# Patient Record
Sex: Female | Born: 1935 | Race: White | Hispanic: No | Smoking: Never smoker
Health system: Southern US, Community
[De-identification: ages and names within clinical notes are randomized; demographics above are authoritative.]

## PROBLEM LIST (undated history)

## (undated) DIAGNOSIS — I739 Peripheral vascular disease, unspecified: Secondary | ICD-10-CM

## (undated) DIAGNOSIS — E785 Hyperlipidemia, unspecified: Secondary | ICD-10-CM

## (undated) DIAGNOSIS — K219 Gastro-esophageal reflux disease without esophagitis: Secondary | ICD-10-CM

## (undated) DIAGNOSIS — I519 Heart disease, unspecified: Secondary | ICD-10-CM

## (undated) DIAGNOSIS — I10 Essential (primary) hypertension: Secondary | ICD-10-CM

## (undated) DIAGNOSIS — I4891 Unspecified atrial fibrillation: Secondary | ICD-10-CM

## (undated) DIAGNOSIS — H409 Unspecified glaucoma: Secondary | ICD-10-CM

## (undated) DIAGNOSIS — M1990 Unspecified osteoarthritis, unspecified site: Secondary | ICD-10-CM

## (undated) HISTORY — DX: Heart disease, unspecified: I51.9

## (undated) HISTORY — PX: TUBAL LIGATION: SHX77

## (undated) HISTORY — PX: CEREBRAL ANEURYSM REPAIR: SHX164

## (undated) HISTORY — PX: ABDOMINAL HYSTERECTOMY: SHX81

## (undated) HISTORY — PX: APPENDECTOMY: SHX54

## (undated) HISTORY — DX: Essential (primary) hypertension: I10

## (undated) HISTORY — DX: Unspecified osteoarthritis, unspecified site: M19.90

## (undated) HISTORY — PX: OTHER SURGICAL HISTORY: SHX169

## (undated) HISTORY — PX: DILATION AND CURETTAGE OF UTERUS: SHX78

---

## 1997-08-28 ENCOUNTER — Inpatient Hospital Stay (HOSPITAL_COMMUNITY): Admission: RE | Admit: 1997-08-28 | Discharge: 1997-08-29 | Payer: Self-pay | Admitting: *Deleted

## 2004-09-03 ENCOUNTER — Other Ambulatory Visit: Payer: Self-pay

## 2004-09-03 ENCOUNTER — Inpatient Hospital Stay: Payer: Self-pay | Admitting: Internal Medicine

## 2005-10-31 ENCOUNTER — Ambulatory Visit: Payer: Self-pay | Admitting: Family

## 2005-12-02 ENCOUNTER — Ambulatory Visit: Payer: Self-pay | Admitting: Psychiatry

## 2005-12-09 ENCOUNTER — Ambulatory Visit (HOSPITAL_COMMUNITY): Admission: RE | Admit: 2005-12-09 | Discharge: 2005-12-09 | Payer: Self-pay | Admitting: *Deleted

## 2006-03-06 ENCOUNTER — Inpatient Hospital Stay (HOSPITAL_COMMUNITY): Admission: RE | Admit: 2006-03-06 | Discharge: 2006-03-07 | Payer: Self-pay | Admitting: *Deleted

## 2007-09-09 ENCOUNTER — Other Ambulatory Visit: Payer: Self-pay

## 2007-09-09 ENCOUNTER — Observation Stay: Payer: Self-pay | Admitting: Internal Medicine

## 2007-10-25 ENCOUNTER — Ambulatory Visit: Payer: Self-pay | Admitting: Internal Medicine

## 2008-03-26 ENCOUNTER — Inpatient Hospital Stay: Payer: Self-pay | Admitting: Internal Medicine

## 2008-06-02 ENCOUNTER — Ambulatory Visit: Payer: Self-pay | Admitting: Internal Medicine

## 2008-06-29 ENCOUNTER — Emergency Department: Payer: Self-pay | Admitting: Emergency Medicine

## 2008-07-28 ENCOUNTER — Ambulatory Visit: Payer: Self-pay | Admitting: Gastroenterology

## 2009-07-20 ENCOUNTER — Inpatient Hospital Stay: Payer: Self-pay | Admitting: Internal Medicine

## 2009-08-02 ENCOUNTER — Inpatient Hospital Stay: Payer: Self-pay | Admitting: Internal Medicine

## 2010-08-19 ENCOUNTER — Ambulatory Visit: Payer: Self-pay | Admitting: Ophthalmology

## 2010-08-31 ENCOUNTER — Ambulatory Visit: Payer: Self-pay | Admitting: Ophthalmology

## 2010-10-05 ENCOUNTER — Observation Stay: Payer: Self-pay | Admitting: Internal Medicine

## 2011-01-11 ENCOUNTER — Ambulatory Visit: Payer: Self-pay | Admitting: Ophthalmology

## 2013-01-23 ENCOUNTER — Encounter: Payer: Self-pay | Admitting: Podiatry

## 2013-01-23 ENCOUNTER — Ambulatory Visit (INDEPENDENT_AMBULATORY_CARE_PROVIDER_SITE_OTHER): Payer: Medicare HMO | Admitting: Podiatry

## 2013-01-23 VITALS — BP 114/43 | HR 61 | Resp 16 | Ht 65.0 in | Wt 158.0 lb

## 2013-01-23 DIAGNOSIS — M79609 Pain in unspecified limb: Secondary | ICD-10-CM

## 2013-01-23 DIAGNOSIS — B351 Tinea unguium: Secondary | ICD-10-CM

## 2013-01-23 NOTE — Progress Notes (Signed)
Lori Roberts presents today a chief complaint of painful toenails one through 5 bilateral.  Objective: Vital signs are stable she is alert and oriented x3 pulses remain palpable bilateral. Nails are thick yellow dystrophic onychomycotic. Hammertoe deformities noted bilateral.  Assessment: Pain in limb secondary to onychomycosis 1 through 5 bilateral.  Plan: Debridement of nails in thickness and length as a covered service followup with her in 3 months.

## 2013-04-24 ENCOUNTER — Encounter: Payer: Self-pay | Admitting: Podiatry

## 2013-04-24 ENCOUNTER — Ambulatory Visit (INDEPENDENT_AMBULATORY_CARE_PROVIDER_SITE_OTHER): Payer: Medicare HMO | Admitting: Podiatry

## 2013-04-24 VITALS — BP 122/63 | HR 74 | Resp 16 | Ht 65.0 in | Wt 158.0 lb

## 2013-04-24 DIAGNOSIS — B351 Tinea unguium: Secondary | ICD-10-CM

## 2013-04-24 DIAGNOSIS — M79609 Pain in unspecified limb: Secondary | ICD-10-CM

## 2013-04-24 NOTE — Progress Notes (Signed)
She presents today with a chief complaint of painful toenails one through 5 bilateral.  Objective: Vital signs are stable she is alert and oriented x3. Pulses are palpable bilateral. Nails are thick yellow dystrophic clinically mycotic 1 through 5 bilateral foot.  Assessment: Pain in limb secondary to onychomycosis 1 through 5 bilateral.  Plan: Discussed the etiology pathology conservative versus surgical therapies debridement nails 1 through 5 bilateral is cover service secondary to pain.

## 2013-07-11 DIAGNOSIS — N189 Chronic kidney disease, unspecified: Secondary | ICD-10-CM | POA: Insufficient documentation

## 2013-07-11 DIAGNOSIS — R0602 Shortness of breath: Secondary | ICD-10-CM | POA: Insufficient documentation

## 2013-07-11 DIAGNOSIS — R011 Cardiac murmur, unspecified: Secondary | ICD-10-CM | POA: Insufficient documentation

## 2013-07-24 ENCOUNTER — Ambulatory Visit (INDEPENDENT_AMBULATORY_CARE_PROVIDER_SITE_OTHER): Payer: Medicare HMO | Admitting: Podiatry

## 2013-07-24 VITALS — BP 123/67 | HR 81 | Resp 16

## 2013-07-24 DIAGNOSIS — B351 Tinea unguium: Secondary | ICD-10-CM

## 2013-07-24 DIAGNOSIS — M79609 Pain in unspecified limb: Secondary | ICD-10-CM

## 2013-07-24 NOTE — Progress Notes (Signed)
She presents today with a chief complaint of painful elongated toenails one through 5 bilateral.  Objective: Ulcers are palpable bilateral. Nails are thick yellow dystrophic with mycotic and painful palpation.  Assessment: Pain in limb secondary to onychomycosis 1 through 5 bilateral.  Plan: Debridement of nails 1 through 5 bilateral covered service secondary to pain.

## 2013-10-24 ENCOUNTER — Encounter: Payer: Self-pay | Admitting: Podiatry

## 2013-10-24 ENCOUNTER — Ambulatory Visit (INDEPENDENT_AMBULATORY_CARE_PROVIDER_SITE_OTHER): Payer: Medicare HMO | Admitting: Podiatry

## 2013-10-24 DIAGNOSIS — M79676 Pain in unspecified toe(s): Secondary | ICD-10-CM

## 2013-10-24 DIAGNOSIS — M79609 Pain in unspecified limb: Secondary | ICD-10-CM

## 2013-10-24 DIAGNOSIS — B351 Tinea unguium: Secondary | ICD-10-CM

## 2013-10-24 NOTE — Progress Notes (Signed)
She presents today chief complaint of painful elongated toenails.  Objective: Thick yellow dystrophic with mycotic and painful palpation.  Assessment: Pain in limb secondary to onychomycosis 1 through 5 bilateral.  Plan: Debridement of nails 1 through 5 bilateral covered service secondary to pain.

## 2013-12-30 ENCOUNTER — Observation Stay: Payer: Self-pay | Admitting: Internal Medicine

## 2013-12-30 LAB — URINALYSIS, COMPLETE
Bilirubin,UR: NEGATIVE
Blood: NEGATIVE
Glucose,UR: NEGATIVE mg/dL (ref 0–75)
Ketone: NEGATIVE
Leukocyte Esterase: NEGATIVE
Nitrite: NEGATIVE
Ph: 6 (ref 4.5–8.0)
Protein: NEGATIVE
RBC,UR: NONE SEEN /HPF (ref 0–5)
Specific Gravity: 1.008 (ref 1.003–1.030)
Squamous Epithelial: NONE SEEN
WBC UR: NONE SEEN /HPF (ref 0–5)

## 2013-12-30 LAB — PROTIME-INR
INR: 1.9
Prothrombin Time: 21.4 secs — ABNORMAL HIGH (ref 11.5–14.7)

## 2013-12-30 LAB — CBC
HCT: 39.4 % (ref 35.0–47.0)
HGB: 12.5 g/dL (ref 12.0–16.0)
MCH: 28.6 pg (ref 26.0–34.0)
MCHC: 31.7 g/dL — AB (ref 32.0–36.0)
MCV: 90 fL (ref 80–100)
Platelet: 149 10*3/uL — ABNORMAL LOW (ref 150–440)
RBC: 4.35 10*6/uL (ref 3.80–5.20)
RDW: 15.8 % — ABNORMAL HIGH (ref 11.5–14.5)
WBC: 7.1 10*3/uL (ref 3.6–11.0)

## 2013-12-30 LAB — TROPONIN I
Troponin-I: 0.29 ng/mL — ABNORMAL HIGH
Troponin-I: 0.29 ng/mL — ABNORMAL HIGH

## 2013-12-30 LAB — BASIC METABOLIC PANEL
Anion Gap: 8 (ref 7–16)
BUN: 37 mg/dL — ABNORMAL HIGH (ref 7–18)
Calcium, Total: 9.2 mg/dL (ref 8.5–10.1)
Chloride: 107 mmol/L (ref 98–107)
Co2: 28 mmol/L (ref 21–32)
Creatinine: 1.63 mg/dL — ABNORMAL HIGH (ref 0.60–1.30)
EGFR (African American): 38 — ABNORMAL LOW
EGFR (Non-African Amer.): 31 — ABNORMAL LOW
Glucose: 94 mg/dL (ref 65–99)
Osmolality: 293 (ref 275–301)
Potassium: 3.6 mmol/L (ref 3.5–5.1)
Sodium: 143 mmol/L (ref 136–145)

## 2013-12-30 LAB — PRO B NATRIURETIC PEPTIDE: B-Type Natriuretic Peptide: 5077 pg/mL — ABNORMAL HIGH (ref 0–450)

## 2013-12-30 LAB — TSH: Thyroid Stimulating Horm: 2.89 u[IU]/mL

## 2013-12-30 LAB — CK TOTAL AND CKMB (NOT AT ARMC)
CK, Total: 87 U/L
CK-MB: 3.5 ng/mL (ref 0.5–3.6)

## 2013-12-31 LAB — BASIC METABOLIC PANEL
Anion Gap: 7 (ref 7–16)
BUN: 33 mg/dL — ABNORMAL HIGH (ref 7–18)
CO2: 27 mmol/L (ref 21–32)
CREATININE: 1.42 mg/dL — AB (ref 0.60–1.30)
Calcium, Total: 8.5 mg/dL (ref 8.5–10.1)
Chloride: 111 mmol/L — ABNORMAL HIGH (ref 98–107)
GFR CALC AF AMER: 45 — AB
GFR CALC NON AF AMER: 37 — AB
Glucose: 97 mg/dL (ref 65–99)
Osmolality: 296 (ref 275–301)
POTASSIUM: 3.4 mmol/L — AB (ref 3.5–5.1)
Sodium: 145 mmol/L (ref 136–145)

## 2013-12-31 LAB — TROPONIN I: TROPONIN-I: 0.26 ng/mL — AB

## 2014-01-10 ENCOUNTER — Emergency Department: Payer: Self-pay | Admitting: Emergency Medicine

## 2014-01-10 LAB — URINALYSIS, COMPLETE
BILIRUBIN, UR: NEGATIVE
Bacteria: NONE SEEN
Blood: NEGATIVE
GLUCOSE, UR: NEGATIVE mg/dL (ref 0–75)
Hyaline Cast: 6
KETONE: NEGATIVE
LEUKOCYTE ESTERASE: NEGATIVE
NITRITE: NEGATIVE
Ph: 5 (ref 4.5–8.0)
Protein: NEGATIVE
Specific Gravity: 1.016 (ref 1.003–1.030)
Squamous Epithelial: 1

## 2014-01-10 LAB — COMPREHENSIVE METABOLIC PANEL
AST: 20 U/L (ref 15–37)
Albumin: 3.6 g/dL (ref 3.4–5.0)
Alkaline Phosphatase: 81 U/L
Anion Gap: 5 — ABNORMAL LOW (ref 7–16)
BUN: 37 mg/dL — ABNORMAL HIGH (ref 7–18)
Bilirubin,Total: 0.4 mg/dL (ref 0.2–1.0)
CREATININE: 1.87 mg/dL — AB (ref 0.60–1.30)
Calcium, Total: 9 mg/dL (ref 8.5–10.1)
Chloride: 106 mmol/L (ref 98–107)
Co2: 31 mmol/L (ref 21–32)
EGFR (African American): 33 — ABNORMAL LOW
GFR CALC NON AF AMER: 27 — AB
Glucose: 144 mg/dL — ABNORMAL HIGH (ref 65–99)
OSMOLALITY: 285 (ref 275–301)
Potassium: 3.8 mmol/L (ref 3.5–5.1)
SGPT (ALT): 28 U/L
Sodium: 142 mmol/L (ref 136–145)
TOTAL PROTEIN: 6.6 g/dL (ref 6.4–8.2)

## 2014-01-10 LAB — CBC
HCT: 39.5 % (ref 35.0–47.0)
HGB: 13 g/dL (ref 12.0–16.0)
MCH: 29.7 pg (ref 26.0–34.0)
MCHC: 33 g/dL (ref 32.0–36.0)
MCV: 90 fL (ref 80–100)
Platelet: 147 10*3/uL — ABNORMAL LOW (ref 150–440)
RBC: 4.38 10*6/uL (ref 3.80–5.20)
RDW: 15.5 % — ABNORMAL HIGH (ref 11.5–14.5)
WBC: 7 10*3/uL (ref 3.6–11.0)

## 2014-01-10 LAB — CK TOTAL AND CKMB (NOT AT ARMC)
CK, Total: 65 U/L
CK-MB: 3.3 ng/mL (ref 0.5–3.6)

## 2014-01-10 LAB — TROPONIN I: Troponin-I: 0.24 ng/mL — ABNORMAL HIGH

## 2014-01-29 ENCOUNTER — Ambulatory Visit: Payer: Medicare HMO | Admitting: Podiatry

## 2014-02-17 ENCOUNTER — Ambulatory Visit (INDEPENDENT_AMBULATORY_CARE_PROVIDER_SITE_OTHER): Payer: Medicare HMO | Admitting: Podiatry

## 2014-02-17 ENCOUNTER — Ambulatory Visit: Payer: Medicare HMO | Admitting: Podiatry

## 2014-02-17 DIAGNOSIS — B351 Tinea unguium: Secondary | ICD-10-CM

## 2014-02-17 DIAGNOSIS — M79676 Pain in unspecified toe(s): Secondary | ICD-10-CM

## 2014-02-17 NOTE — Progress Notes (Signed)
She presents today chief complaint of painful elongated toenails.  Objective: Thick yellow dystrophic with mycotic and painful palpation.  Assessment: Pain in limb secondary to onychomycosis 1 through 5 bilateral.  Plan: Debridement of nails 1 through 5 bilateral covered service secondary to pain.

## 2014-05-19 ENCOUNTER — Ambulatory Visit: Payer: Medicare HMO

## 2014-05-28 ENCOUNTER — Ambulatory Visit (INDEPENDENT_AMBULATORY_CARE_PROVIDER_SITE_OTHER): Payer: Commercial Managed Care - HMO | Admitting: Podiatry

## 2014-05-28 DIAGNOSIS — M79676 Pain in unspecified toe(s): Secondary | ICD-10-CM

## 2014-05-28 DIAGNOSIS — B351 Tinea unguium: Secondary | ICD-10-CM

## 2014-05-28 NOTE — Progress Notes (Signed)
Presents today chief complaint of painful elongated toenails.  Objective: Pulses are palpable bilateral nails are thick, yellow dystrophic onychomycosis and painful palpation.   Assessment: Onychomycosis with pain in limb.  Plan: Treatment of nails in thickness and length as covered service secondary to pain.  

## 2014-07-12 NOTE — Consult Note (Signed)
PATIENT NAME:  Lori Roberts, PRAZAK MR#:  [id number] DATE OF BIRTH:  03/17/1922  DATE OF CONSULTATION:  12/31/2013  REFERRING PHYSICIAN:  Emergency Room  CONSULTING PHYSICIAN:  Fabienne Nolasco D. Clayborn Bigness, MD  CARDIOLOGY CONSULTATION    PRIMARY CARE PHYSICIAN:  Dr. Lavera Guise.  INDICATIONS: The patient has palpitations.   HISTORY OF PRESENT ILLNESS: The patient is a 81 year old female with a history ofparoxysmal AFib, SVT, coronary artery disease, hypertension, peripheral vascular disease, and hypertension, who presented with worsening palpitations and tachycardia while at rest. This was going on for a few minutes and did not get better, so she finally came to the Emergency Room. In the ER she was found to be in rapid atrial fibrillation with elevated blood pressure. She was given Lopressor. She was found to have an elevated troponin, and she is being admitted for further evaluation. She denied any discrete chest pain. She had minimal shortness of breath, mild vertigo, no syncope. She has not changed her medications, and has been compliant with the doses. She saw her primary care physician about a week ago.  The patient, by the time I saw her, felt better. Her palpitations had improved. She felt back to normal.   PAST MEDICAL HISTORY: Atrial fibrillation, hypertension, hyperlipidemia, renal artery stenosis, GERD, coronary artery disease, chronic renal insufficiency, history of SVT.   PAST SURGICAL HISTORY: Hysterectomy, brain aneurysm, right renal artery stent placement, PCI and stents to coronary arteries.   ALLERGIES: IODINE, AZOR, CLINDAMYCIN, SULFA, PENICILLIN, PHENOBARBITAL, CLONIDINE.  SOCIAL HISTORY: Lives alone. Uses a cane. Uses a bicycle 3 times a week. Does not smoke or drink.    FAMILY HISTORY: Brother died of cancer. Father died of myocardial infarction at age 81.  Marland Kitchen No nausea or vomiting. Denies fevers or chills. No sweats. No weight loss, No weight gain. Denies hemoptysis or hematemesis. No bright  red blood per rectum. No vision change or hearing change. Denies sputum production. Denies cough. She had significant palpitations, tachycardia, mild weakness, mild fatigue, no chest pain, no syncope.  MEDICATIONS: Aspirin 81 mg a day, Benicar 20 a day, Bumex 0.5 a day, Cetirizine  10 mg as needed, Coumadin 5 mg a day, diazepam 5 mg 4 times a day, Imdur 30 mg a day, Lipitor 20 a day, Plavix 75 a day, sotalol 120 mg twice a day, Travatan eye drops daily as needed, VESIcare 5 mg a day.  PHYSICAL EXAMINATION: VITAL SIGNS:  Blood pressure was 170/95, pulse of around 100 and regular, respiratory rate 16, afebrile.  HEENT: Normocephalic, atraumatic. Pupils equal and reactive to light.  NECK: Supple. No significant JVD, bruits or adenopathy.  LUNGS: clear to auscultation and percussion. No significant wheezing, rhonchi, or rale.  HEART: Regular rate and rhythm. Slightly tachycardic. Systolic ejection murmur heard at the left sternal border. Positive S4.  ABDOMEN: Benign.  EXTREMITIES: Within normal limits.  NEUROLOGIC: Intact.  SKIN: Normal.   LABORATORIES: Glucose 94. BNP 5000. BUN 37, creatinine 1.6, sodium 140, potassium 3.6, chloride 107. CK 81. Troponin 0.29. TSH 2.89. White count 7, hemoglobin 12.5, platelet count 149,000. INR 1.9. Urinalysis is basically unremarkable. EKG initially atrial fibrillation, rate of 110, nonspecific findings.   Chest x-ray showed no acute findings.   ASSESSMENT: Atrial fibrillation paroxysmal, palpitations, hypertension, renal insufficiency, coronary artery disease, peripheral vascular disease, hyperlipidemia, gastroesophageal reflux disease.  PLAN: 1. Agree with   rule out for myocardial infarction. Follow up troponins. Cardiac enzymes. Follow up EKG. Continue Coumadin for anticoagulation. Continue rate control and rhythm control with  sotalol.  2.  For atrial fibrillation, again recommend continuing sotalol at 120 twice a day. Do not recommend changing the current  dose. Also, continue Coumadin for anticoagulation. She is at 1.9, which is the lower limit of normal for anticoagulation. Do not recommend advancing the dose at this stage. Will have the patient follow up with the primary physician for INR management.  3.  Renal insufficiency. Continue to follow renal function. Will have the patient follow with nephrology as an outpatient. Continue current therapy.  4.  Hypertension. I recommend continued aggressive anti-hypertensive medications. She has been given extra doses of metoprolol to help blood pressure and rate control, which also should help her blood pressure.  5.  Hyperlipidemia. Continue statin therapy.  6.  Continue deep vein thrombosis prophylaxis.  7.    Continue conservative medical course. Do not recommend any invasive studies. Do not recommend pacemaker treatment or cardioversion at this stage. If the patient's rate is under 100, would recommend conservative treatment and have the patient follow up with her primary physician and cardiologist as an outpatient.    ____________________________ Loran Senters. Clayborn Bigness, MD ddc:MT D: 01/01/2014 08:31:01 ET T: 01/01/2014 11:34:07 ET JOB#: [id number]  cc: Terrian Ridlon D. Clayborn Bigness, MD, <Dictator> Yolonda Kida MD ELECTRONICALLY SIGNED 01/22/2014 10:50

## 2014-07-12 NOTE — H&P (Signed)
PATIENT NAME:  Lori, Roberts MR#:  [id number] DATE OF BIRTH:  1921/08/25  DATE OF ADMISSION:  12/30/2013  PRIMARY CARE PHYSICIAN: Dr. Lavera Guise.  CARDIOLOGY: Dr. Clayborn Bigness.   CHIEF COMPLAINT: Palpitations.   HISTORY OF PRESENT ILLNESS: A 81 year old female patient with history of proximal atrial fibrillation, CAD, hypertension brought into the Emergency Room after she noticed she was having palpitations. On arrival the patient was found to be tachycardic, atrial fibrillation with RVR, and also increased blood pressure, was given a dose of Lopressor. The patient was found to have elevated troponin and is being admitted to the hospitalist service. The patient had palpitations, but no chest pain, shortness of breath, or lightheadedness or syncope. The patient has been on same medication. No recent change in medications. She did see Dr. Lavera Guise 1 week prior, with no change in medications.   Presently, the patient feels a little better. Palpitations have resolved. Heart rate still continues to be around 100.   PAST MEDICAL HISTORY:  1. Atrial fibrillation, chronic, on anticoagulation with Coumadin.  2. Hypertension.  3. Hyperlipidemia.  4. Renal artery stenosis with right renal artery stent placement in 2011.  5. GERD.  6. CAD with mild disease on the left and a mid RCA. There was 100% stenosis on her cath in 2011.  7. Chronic kidney disease stage III with baseline creatinine of 1.5 to 1.6.   PAST SURGICAL HISTORY: Hysterectomy, a brain aneurysm surgery, and right renal artery stent placement.   ALLERGIES: IODINE, AZOR, CLINDAMYCIN, SULFA, PENICILLIN, PHENOBARBITAL, AND CLONIDINE.   SOCIAL HISTORY: The patient lives alone. She uses a cane, but continues to cycle about a mile 3 times a week. Does not smoke. Adds a tablespoon of Kahlua to her coffee daily.   CODE STATUS: Full code.   FAMILY HISTORY: Father died of MI at age of 80. Brother had cancer.   REVIEW OF SYSTEMS: CONSTITUTIONAL: No  fever, fatigue, weakness.  HEENT: Eyes: No blurred vision, pain. ENT: No tinnitus, ear pain, hearing loss.  RESPIRATORY: No cough, wheeze, hemoptysis.  CARDIOVASCULAR: No chest pain, orthopnea, does have palpitations.  GASTROINTESTINAL: No nausea, vomiting, diarrhea, abdominal pain. GENITOURINARY: No dysuria, hematuria, or frequency.  ENDOCRINE: No polyuria, nocturia, or thyroid problems. HEMATOLOGY AND LYMPHATIC: No anemia, easy bruising, bleeding.  INTEGUMENTARY: No acne, rash, lesion.  MUSCULOSKELETAL: No back pain. Does have arthritis.  NEUROLOGICAL: No focal numbness, weakness, seizure.  PSYCHIATRIC: No anxiety or depression.  LYMPHATIC: No cervical lymphadenopathy.   HOME MEDICATIONS:  1. Aspirin 81 mg daily.  2. Benicar 20 mg  3. Bumex 0.5 mg daily. 4. Cetirizine 10 mg daily as needed.  5. Coumadin 5 mg daily.  6. Diazepam 5 mg 4 times a day as needed.  7. Isosorbide mononitrate 30 mg daily.  8. Lipitor 20 mg daily.  9. Plavix 75 mg daily.  10. Sotalol 120 mg oral 2 times a day.  11. Travatan ophthalmic drops 1 drop daily.  12. VESIcare 5 mg oral daily.   PHYSICAL EXAMINATION:  VITAL SIGNS: Temperature 98.8, pulse of 120, blood pressure 172/99, saturating 99% on room air.  GENERAL: Obese Caucasian female patient lying in bed, seems comfortable, conversational, cooperative with exam.  PSYCHIATRIC: Is alert and oriented x 3. Mood and affect appropriate. Judgment intact.  HEENT: Atraumatic, normocephalic. Oral mucosa are moist and pink is normal.  . No pallor. No icterus. Pupils bilaterally equal and reactive to light.  NECK: Supple. No thyromegaly. No palpable lymph nodes. Trachea midline. No carotid bruits, no  JVD.  CARDIOVASCULAR: S1, S2, irregular without any murmurs. Peripheral pulses 2+. No edema.  RESPIRATORY: Normal work of breathing. Clear to auscultation on both sides.  GASTROINTESTINAL: Soft abdomen, nontender. Bowel sounds present. No hepatosplenomegaly  palpable. SKIN: Warm and dry. Has diffuse bruising in all extremities.  MUSCULOSKELETAL: No joint swelling, redness, effusion,joints. Normal muscle tone.  NEUROLOGICAL: Motor strength 5/5 in upper and lower extremities. Sensation   intact all over.  LYMPHATIC: No cervical lymphadenopathy.   LABORATORY STUDIES: Show glucose of 94. BNP of 5000. BUN 37, creatinine 1.63 with sodium 142, potassium 3.6, chloride 107, CK of 87, CK-MB 3.5, troponin of 0.29 with TSH of 2.89.   WBC 7.1, hemoglobin 12.5, platelets of 149,000, INR 1.9.   Urinalysis shows trace bacteria but no WBCs.   EKG shows atrial fibrillation with heart rate of 104. No acute ST elevation found.   Chest x-ray, portable, shows chronic bronchiectatic markings, nothing acute. No change from prior chest x-ray.   ASSESSMENT AND PLAN:  1. Atrial fibrillation with rapid ventricular rate in a patient with chronic atrial fibrillation. The patient is presently on sotalol which will be continued. We will add IV Lopressor p.r.n. If her heart rate stays uncontrolled, she may need a small dose of Lopressor. Going home we will add Toprol-XL 25 daily. She does have elevated troponin. She seems to have chronic elevation in troponin, for on 0.1 to 0.2, presently at 0.29. Will get 2 more sets of cardiac enzymes. She does not have any chest pain or shortness of breath. Her elevation is secondary to atrial fibrillation with rapid ventricular rate from the demand ischemia, not non-ST segment elevation myocardial infarction.  2. Uncontrolled hypertension. Restart home medications and use p.r.n. medications and titrate as needed.  3. Chronic kidney disease stage III, stable.  4. Deep vein thrombosis prophylaxis. The patient is on Coumadin for her atrial fibrillation with INR of 1.9.   CODE STATUS: Full code.   TIME SPENT TODAY ON THIS CASE: 40 minutes.    ____________________________ Leia Alf Jhordan Mckibben, MD srs:lt D: 12/30/2013 18:08:33 ET T: 12/30/2013  18:51:13 ET JOB#: [id number]  cc: Alveta Heimlich R. Lanyah Spengler, MD, <Dictator> Cletis Athens, MD Dwayne D. Clayborn Bigness, MD Neita Carp MD ELECTRONICALLY SIGNED 01/01/2014 16:33

## 2014-07-12 NOTE — Discharge Summary (Signed)
PATIENT NAME:  Lori Roberts, Lori Roberts MR#:  [id number] DATE OF BIRTH:  [birth year]  DATE OF ADMISSION:  12/30/2013 DATE OF DISCHARGE:  12/31/2013  DISCHARGE DIAGNOSES:  1.  Atrial fibrillation with rapid ventricular response.  2.  Paroxysmal atrial fibrillation.  3.  Elevated troponins.  4.  Hypertension.  5.  Chronic kidney disease stage 3.   CONSULTATIONS: Lujean Amel, MD, cardiology.   PROCEDURES: Chest x-ray October 12 shows chronic bronchitic markings. No acute cardiopulmonary process.   HISTORY OF PRESENT ILLNESS: This is very pleasant 81 year old female with history of proximal atrial fibrillation, coronary artery disease, hypertension is brought to the Emergency Room after she noticed having palpitations. On arrival she was found to be tachycardic, in atrial fibrillation with a rapid ventricular response and also hypertensive. She responded well to 1 dose of Lopressor. She has had elevated troponins and was admitted for ACS rule out.   HOSPITAL COURSE AND TREATMENT:  1.  Atrial fibrillation with rapid ventricular response: Etiology of this exacerbation is unclear. This patient has a history of paroxysmal atrial fibrillation. She is rate controlled at the time of discharge, after receiving 1 dose of Lopressor IV. I have discussed her case with Dr. Clayborn Bigness, who saw her in the hospital, and as she is asymptomatic and rate controlled, we will discharge her home without any changes to her home regimen. She will follow up with Dr. Clayborn Bigness in his clinic in 1 week.  2.  Paroxysmal atrial fibrillation: Continue with prior regimen of sotalol 120 mg 1 tablet twice a day and Coumadin for anticoagulation.  3.  Elevated troponin: The patient did not have chest pain or shortness of breath at any time. She does have chronically elevated troponins on previous admissions with troponins of 0.1 to 0.2. She presented with a troponin of 0.29 which decreased to 0.26. Elevation in troponin likely secondary to  atrial fibrillation with RVR from the demand of rapid ventricular rate. There were no EKG changes. Acute coronary event is not suspected.  4.  Hypertension: She was hypertensive at presentation, which resolved after control of her atrial fibrillation. She is discharged on her former home regimen.   DISCHARGE PHYSICAL EXAMINATION:  VITAL SIGNS: Temperature 97.6, pulse 60, respirations 19, blood pressure 169/74, orthostatic vital signs are positive for a small increase in heart rate upon standing from 156 to 170 (this was performed soon after admission), oxygenation 97% at rest on room air.  GENERAL: No acute distress.  CARDIOVASCULAR: Regular rate and rhythm; no murmurs, rubs or gallops; no peripheral edema. Peripheral pulses are 1+.  RESPIRATORY: Lungs are clear to auscultation bilaterally with good air movement.  ABDOMEN: Soft, nontender, bowel sounds are normal.   LABORATORY DATA: Sodium 145, potassium 3.4 (this was repleted prior to discharge), chloride 111, bicarb 27, BUN 33, creatinine 1.42. Troponins 0.29, decreasing to 0.26. White blood cells 7.1, hemoglobin 12.5, platelets 149,000, MCV 90. Urinalysis with no signs of urinary tract infection.   CONDITION ON DISCHARGE: Stable.   DISPOSITION: The patient is being discharged back to her prior residence.   DISCHARGE MEDICATIONS:  1.  Plavix 75 mg 1 tablet once a day.  2.  VESIcare 5 mg 1 tablet once a day.  3.  Aspirin 81 mg 1 tablet once a day.  4.  Sotalol 120 mg 1 tablet twice a day.  5.  Coumadin 5 mg orally once a day.  6.  Dorzolamide/timolol ophthalmic 2.3% /0.68% ophthalmic solution 1 drop to each eye twice a day.  7.  Isosorbide mononitrate 30 mg oral tablet 1 tablet once a day.  8.  Travatan 0.004% ophthalmic solution 1 drop to each eye once a day.  9.  Triamcinolone topical 0.1% topical ointment apply to affected area 3 times a day.  10.  Benicar 40 mg, 0.5 tablets orally once a day.  11.  Bumetanide 0.5 mg 1 tablet once a  day.  12.  Lipitor 20 mg 1 tablet once a day at bedtime.  13.  Diazepam 5 mg 1 tablet 4 times a day as needed for anxiety.  14.  ceterizine10 mg 1 tablet once a day as needed for allergies.   DISCHARGE INSTRUCTIONS: The patient does not need home health or home oxygen.   DIET: Low-fat, low-cholesterol.   ACTIVITY LIMITATION: None.   TIMEFRAME FOR FOLLOWUP: One to two weeks with Dr. Clayborn Bigness.   TIME SPENT ON DISCHARGE: Forty minutes.    ____________________________ Earleen Newport. Volanda Napoleon, MD cpw:TT D: 01/01/2014 16:23:05 ET T: 01/01/2014 20:34:47 ET JOB#: [id number]  cc: Earleen Newport. Volanda Napoleon, MD, <Dictator> Aldean Jewett MD ELECTRONICALLY SIGNED 01/02/2014 15:03

## 2014-08-17 ENCOUNTER — Encounter: Payer: Self-pay | Admitting: Emergency Medicine

## 2014-08-17 ENCOUNTER — Emergency Department
Admission: EM | Admit: 2014-08-17 | Discharge: 2014-08-17 | Disposition: A | Discharge status: Home / Self Care | Payer: Commercial Managed Care - HMO | Attending: Emergency Medicine | Admitting: Emergency Medicine

## 2014-08-17 DIAGNOSIS — Z880 Allergy status to penicillin: Secondary | ICD-10-CM | POA: Diagnosis not present

## 2014-08-17 DIAGNOSIS — Z79899 Other long term (current) drug therapy: Secondary | ICD-10-CM | POA: Diagnosis not present

## 2014-08-17 DIAGNOSIS — R197 Diarrhea, unspecified: Secondary | ICD-10-CM | POA: Diagnosis not present

## 2014-08-17 DIAGNOSIS — Z7902 Long term (current) use of antithrombotics/antiplatelets: Secondary | ICD-10-CM | POA: Insufficient documentation

## 2014-08-17 DIAGNOSIS — R109 Unspecified abdominal pain: Secondary | ICD-10-CM | POA: Diagnosis present

## 2014-08-17 DIAGNOSIS — R195 Other fecal abnormalities: Secondary | ICD-10-CM

## 2014-08-17 DIAGNOSIS — Z7901 Long term (current) use of anticoagulants: Secondary | ICD-10-CM | POA: Insufficient documentation

## 2014-08-17 LAB — COMPREHENSIVE METABOLIC PANEL
ALK PHOS: 68 U/L (ref 38–126)
ALT: 29 U/L (ref 14–54)
AST: 23 U/L (ref 15–41)
Albumin: 4 g/dL (ref 3.5–5.0)
Anion gap: 8 (ref 5–15)
BILIRUBIN TOTAL: 0.6 mg/dL (ref 0.3–1.2)
BUN: 37 mg/dL — AB (ref 6–20)
CALCIUM: 9.1 mg/dL (ref 8.9–10.3)
CO2: 24 mmol/L (ref 22–32)
Chloride: 108 mmol/L (ref 101–111)
Creatinine, Ser: 1.38 mg/dL — ABNORMAL HIGH (ref 0.44–1.00)
GFR calc non Af Amer: 33 mL/min — ABNORMAL LOW (ref >60)
GFR, EST AFRICAN AMERICAN: 38 mL/min — AB (ref >60)
GLUCOSE: 109 mg/dL — AB (ref 65–99)
POTASSIUM: 3.3 mmol/L — AB (ref 3.5–5.1)
Sodium: 140 mmol/L (ref 135–145)
TOTAL PROTEIN: 6.6 g/dL (ref 6.5–8.1)

## 2014-08-17 LAB — CBC WITH DIFFERENTIAL/PLATELET
Basophils Absolute: 0.1 10*3/uL (ref 0–0.1)
Basophils Relative: 1 %
EOS ABS: 0 10*3/uL (ref 0–0.7)
Eosinophils Relative: 0 %
HCT: 36.3 % (ref 35.0–47.0)
Hemoglobin: 12.1 g/dL (ref 12.0–16.0)
LYMPHS PCT: 11 %
Lymphs Abs: 1.1 10*3/uL (ref 1.0–3.6)
MCH: 28.6 pg (ref 26.0–34.0)
MCHC: 33.4 g/dL (ref 32.0–36.0)
MCV: 85.6 fL (ref 80.0–100.0)
MONO ABS: 1.1 10*3/uL — AB (ref 0.2–0.9)
Monocytes Relative: 11 %
NEUTROS PCT: 77 %
Neutro Abs: 7.8 10*3/uL — ABNORMAL HIGH (ref 1.4–6.5)
Platelets: 143 10*3/uL — ABNORMAL LOW (ref 150–440)
RBC: 4.24 MIL/uL (ref 3.80–5.20)
RDW: 16.5 % — AB (ref 11.5–14.5)
WBC: 10.1 10*3/uL (ref 3.6–11.0)

## 2014-08-17 LAB — LIPASE, BLOOD: LIPASE: 63 U/L — AB (ref 22–51)

## 2014-08-17 LAB — C DIFFICILE QUICK SCREEN W PCR REFLEX
C DIFFICILE (CDIFF) TOXIN: NEGATIVE
C Diff antigen: NEGATIVE
C Diff interpretation: NEGATIVE

## 2014-08-17 LAB — PROTIME-INR
INR: 1.4
Prothrombin Time: 17.4 seconds — ABNORMAL HIGH (ref 11.4–15.0)

## 2014-08-17 LAB — TROPONIN I: Troponin I: 0.03 ng/mL (ref <0.031)

## 2014-08-17 MED ORDER — CIPROFLOXACIN HCL 500 MG PO TABS
500.0000 mg | ORAL_TABLET | Freq: Once | ORAL | Status: AC
  Administered 2014-08-17: 500 mg via ORAL

## 2014-08-17 MED ORDER — PANTOPRAZOLE SODIUM 40 MG PO TBEC: 40.0000 mg | DELAYED_RELEASE_TABLET | Freq: Every day | ORAL | Status: AC

## 2014-08-17 MED ORDER — METRONIDAZOLE 500 MG PO TABS: 500.0000 mg | ORAL_TABLET | Freq: Three times a day (TID) | ORAL | Status: AC

## 2014-08-17 MED ORDER — PANTOPRAZOLE SODIUM 40 MG PO TBEC
DELAYED_RELEASE_TABLET | ORAL | Status: AC
  Administered 2014-08-17: 40 mg via ORAL
  Filled 2014-08-17: qty 1

## 2014-08-17 MED ORDER — METRONIDAZOLE 500 MG PO TABS
ORAL_TABLET | ORAL | Status: AC
  Administered 2014-08-17: 500 mg via ORAL
  Filled 2014-08-17: qty 1

## 2014-08-17 MED ORDER — HYDRALAZINE HCL 25 MG PO TABS
25.0000 mg | ORAL_TABLET | Freq: Once | ORAL | Status: AC
  Administered 2014-08-17: 25 mg via ORAL

## 2014-08-17 MED ORDER — PANTOPRAZOLE SODIUM 40 MG PO TBEC
40.0000 mg | DELAYED_RELEASE_TABLET | Freq: Once | ORAL | Status: AC
  Administered 2014-08-17: 40 mg via ORAL

## 2014-08-17 MED ORDER — CIPROFLOXACIN HCL 500 MG PO TABS
500.0000 mg | ORAL_TABLET | Freq: Two times a day (BID) | ORAL | Status: AC
Start: 2014-08-17 — End: 2014-08-27

## 2014-08-17 MED ORDER — METRONIDAZOLE 500 MG PO TABS
500.0000 mg | ORAL_TABLET | Freq: Once | ORAL | Status: AC
  Administered 2014-08-17: 500 mg via ORAL

## 2014-08-17 MED ORDER — HYDRALAZINE HCL 25 MG PO TABS
ORAL_TABLET | ORAL | Status: AC
  Administered 2014-08-17: 25 mg via ORAL
  Filled 2014-08-17: qty 1

## 2014-08-17 MED ORDER — LOPERAMIDE HCL 2 MG PO CAPS: 2.0000 mg | ORAL_CAPSULE | ORAL | Status: AC | PRN

## 2014-08-17 MED ORDER — CIPROFLOXACIN HCL 500 MG PO TABS
ORAL_TABLET | ORAL | Status: AC
  Administered 2014-08-17: 500 mg via ORAL
  Filled 2014-08-17: qty 1

## 2014-08-17 NOTE — ED Notes (Signed)
Pt presents to ED with abdominal pain x 3days. EMS states pt c/o stomach "growling and making noise constantly". EMS states that per pt stool has been dark for the past few days, and stool was dark in last bm today.

## 2014-08-17 NOTE — ED Provider Notes (Signed)
[facility] Emergency Department Provider Note     Time seen: ----------------------------------------- 6:46 PM on 08/17/2014 -----------------------------------------    I have reviewed the triage vital signs and the nursing notes.   HISTORY  Chief Complaint Abdominal Pain and Rectal Bleeding    HPI Lori Roberts is a 81 y.o. female who presents ER being brought by EMS for abdominal pain and diarrhea for 3 days. Patient reports his symptoms are gone for over week, EMS states reportedly [place] 3 days. Coronary reports she's had some dark stools and there was questionable blood in the stool today with a being very dark almost black. She does take Coumadin. Patient states pain is dull it's in the lower midline abdomen, nothing makes it better or worse. Denies history of same.     Past Medical History  Diagnosis Date  . Heart trouble   . Arthritis   . HBP (high blood pressure)     There are no active problems to display for this patient.   Past Surgical History  Procedure Laterality Date  . Abdominal hysterectomy    . Kidney stent    . Cerebral aneurysm repair    . Dilation and curettage of uterus    . Appendectomy    . Tubal ligation      Current Outpatient Rx  Name  Route  Sig  Dispense  Refill  . amLODipine (NORVASC) 5 MG tablet               . atorvastatin (LIPITOR) 20 MG tablet               . BENICAR 40 MG tablet               . bumetanide (BUMEX) 0.5 MG tablet               . clopidogrel (PLAVIX) 75 MG tablet               . dorzolamide-timolol (COSOPT) 22.3-6.8 MG/ML ophthalmic solution               . hydrALAZINE (APRESOLINE) 25 MG tablet               . isosorbide mononitrate (IMDUR) 30 MG 24 hr tablet               . sotalol (BETAPACE) 120 MG tablet               . TRAVATAN Z 0.004 % SOLN ophthalmic solution               . VESICARE 5 MG tablet               . warfarin  (COUMADIN) 5 MG tablet                 Allergies Wool alcohol; Azor; Bactrim; Clindamycin/lincomycin; Mebaral; Penicillins; and Sulfa antibiotics  History reviewed. No pertinent family history.  Social History History  Substance Use Topics  . Smoking status: Never Smoker   . Smokeless tobacco: Never Used  . Alcohol Use: Yes     Comment: WEEKLY    Review of Systems Constitutional: Negative for fever. Eyes: Negative for visual changes. ENT: Negative for sore throat. Cardiovascular: Negative for chest pain. Respiratory: Negative for shortness of breath. Gastrointestinal: Positive for abdominal pain and diarrhea Genitourinary: Negative for dysuria. Musculoskeletal: Negative for back pain. Skin: Negative for rash. Neurological: Negative for headaches, focal weakness or numbness.  10-point ROS otherwise negative.  ____________________________________________   PHYSICAL EXAM:  VITAL SIGNS: ED Triage Vitals  Enc Vitals Group     BP 08/17/14 1835 185/121 mmHg     Pulse Rate 08/17/14 1835 103     Resp --      Temp 08/17/14 1835 98.7 F (37.1 C)     Temp Source 08/17/14 1835 Oral     SpO2 08/17/14 1835 96 %     Weight 08/17/14 1835 152 lb (68.947 kg)     Height 08/17/14 1835 5' 5.5" (1.664 m)     Head Cir --      Peak Flow --      Pain Score --      Pain Loc --      Pain Edu? --      Excl. in [place]? --     Constitutional: Alert and oriented. Well appearing and in no distress. Eyes: Conjunctivae are normal. PERRL. Normal extraocular movements. ENT   Head: Normocephalic and atraumatic.   Nose: No congestion/rhinnorhea.   Mouth/Throat: Mucous membranes are moist.   Neck: No stridor. Hematological/Lymphatic/Immunilogical: No cervical lymphadenopathy. Cardiovascular: Irregularly irregular rhythm. Normal and symmetric distal pulses are present in all extremities. No murmurs, rubs, or gallops. Respiratory: Normal respiratory effort without tachypnea nor  retractions. Breath sounds are clear and equal bilaterally. No wheezes/rales/rhonchi. Gastrointestinal: Soft and nontender. No distention. No abdominal bruits. There is no CVA tenderness. Rectal: Dark stool present, heme positive. Musculoskeletal: Nontender with normal range of motion in all extremities. No joint effusions.  No lower extremity tenderness nor edema. Neurologic:  Normal speech and language. No gross focal neurologic deficits are appreciated. Speech is normal. No gait instability. Skin:  Skin is warm, dry and intact. No rash noted. Psychiatric: Mood and affect are normal. Speech and behavior are normal. ____________________________________________    LABS (pertinent positives/negatives)  Labs Reviewed  CBC WITH DIFFERENTIAL/PLATELET - Abnormal; Notable for the following:    RDW 16.5 (*)    Platelets 143 (*)    Neutro Abs 7.8 (*)    Monocytes Absolute 1.1 (*)    All other components within normal limits  COMPREHENSIVE METABOLIC PANEL - Abnormal; Notable for the following:    Potassium 3.3 (*)    Glucose, Bld 109 (*)    BUN 37 (*)    Creatinine, Ser 1.38 (*)    GFR calc non Af Amer 33 (*)    GFR calc Af Amer 38 (*)    All other components within normal limits  LIPASE, BLOOD - Abnormal; Notable for the following:    Lipase 63 (*)    All other components within normal limits  PROTIME-INR - Abnormal; Notable for the following:    Prothrombin Time 17.4 (*)    All other components within normal limits  STOOL CULTURE  C DIFFICILE QUICK SCAN W PCR REFLEX ([facility] ONLY)  TROPONIN I  URINALYSIS COMPLETEWITH MICROSCOPIC ([facility] ONLY)   ________________________________  ED COURSE:  Pertinent labs & imaging results that were available during my care of the patient were reviewed by me and considered in my medical decision making (see chart for details).  Check labs, coags and  reevaluate. ____________________________________________   RADIOLOGY  None  ____________________________________________    FINAL ASSESSMENT AND PLAN  Abdominal pain, diarrhea, rectal bleeding  Plan: Patient's abdomen remained soft here, her labs are grossly unremarkable, no leukocytosis no anemia no significant elevated troponin or INR. She does not want any imaging at this time, but is advised to hold Coumadin until her black  stools resolved. We sent a stool culture, I'll discharge her with Cipro and Flagyl, mild antidiarrheal agents and follow-up with her primary care doctor in 2-3 days if no better.    Earleen Newport, MD   Earleen Newport, MD 08/17/14 2040

## 2014-08-17 NOTE — Discharge Instructions (Signed)
Bloody Diarrhea Bloody diarrhea can be caused by many different conditions. Most of the time bloody diarrhea is the result of food poisoning or minor infections. Bloody diarrhea usually improves over 2 to 3 days of rest and fluid replacement. Other conditions that can cause bloody diarrhea include:  Internal bleeding.  Infection.  Diseases of the bowel and colon. Internal bleeding from an ulcer or bowel disease can be severe and requires hospital care or even surgery. DIAGNOSIS  To find out what is wrong your caregiver may check your:  Stool.  Blood.  Results from a test that looks inside the body (endoscopy). TREATMENT   Get plenty of rest.  Drink enough water and fluids to keep your urine clear or pale yellow.  Do not smoke.  Solid foods and dairy products should be avoided until your illness improves.  As you improve, slowly return to a regular diet with easily-digested foods first. Examples are:  Bananas.  Rice.  Toast.  Crackers. You should only need these for about 2 days before adding more normal foods to your diet.  Avoid spicy or fatty foods as well as caffeine and alcohol for several days.  Medicine to control cramping and diarrhea can relieve symptoms but may prolong some cases of bloody diarrhea. Antibiotics can speed recovery from diarrhea due to some bacterial infections. Call your caregiver if diarrhea does not get better in 3 days. SEEK MEDICAL CARE IF:   You do not improve after 3 days.  Your diarrhea improves but your stool appears black. SEEK IMMEDIATE MEDICAL CARE IF:   You become extremely weak or faint.  You become very sweaty.  You have increased pain or bleeding.  You develop repeated vomiting.  You vomit and you see blood or the vomit looks black in color.  You have a fever. Document Released: 03/07/2005 Document Revised: 05/30/2011 Document Reviewed: 02/06/2009 [facility] Patient Information 2015 [place], [place]. This information is  not intended to replace advice given to you by your health care provider. Make sure you discuss any questions you have with your health care provider.  Abdominal Pain Many things can cause abdominal pain. Usually, abdominal pain is not caused by a disease and will improve without treatment. It can often be observed and treated at home. Your health care provider will do a physical exam and possibly order blood tests and X-rays to help determine the seriousness of your pain. However, in many cases, more time must pass before a clear cause of the pain can be found. Before that point, your health care provider may not know if you need more testing or further treatment. HOME CARE INSTRUCTIONS  Monitor your abdominal pain for any changes. The following actions may help to alleviate any discomfort you are experiencing:  Only take over-the-counter or prescription medicines as directed by your health care provider.  Do not take laxatives unless directed to do so by your health care provider.  Try a clear liquid diet (broth, tea, or water) as directed by your health care provider. Slowly move to a bland diet as tolerated. SEEK MEDICAL CARE IF:  You have unexplained abdominal pain.  You have abdominal pain associated with nausea or diarrhea.  You have pain when you urinate or have a bowel movement.  You experience abdominal pain that wakes you in the night.  You have abdominal pain that is worsened or improved by eating food.  You have abdominal pain that is worsened with eating fatty foods.  You have a fever. SEEK  IMMEDIATE MEDICAL CARE IF:   Your pain does not go away within 2 hours.  You keep throwing up (vomiting).  Your pain is felt only in portions of the abdomen, such as the right side or the left lower portion of the abdomen.  You pass bloody or black tarry stools. MAKE SURE YOU:  Understand these instructions.   Will watch your condition.   Will get help right away if you  are not doing well or get worse.  Document Released: 12/15/2004 Document Revised: 03/12/2013 Document Reviewed: 11/14/2012 [facility] Patient Information 2015 [place], [place]. This information is not intended to replace advice given to you by your health care provider. Make sure you discuss any questions you have with your health care provider.

## 2014-08-20 LAB — STOOL CULTURE

## 2014-08-29 ENCOUNTER — Encounter
Admission: EM | Disposition: A | Discharge status: Home / Self Care | Payer: Self-pay | Source: Home / Self Care | Attending: Internal Medicine

## 2014-08-29 ENCOUNTER — Emergency Department: Payer: Medicare Other

## 2014-08-29 ENCOUNTER — Emergency Department: Payer: Medicare Other | Admitting: Registered Nurse

## 2014-08-29 ENCOUNTER — Encounter: Payer: Self-pay | Admitting: Specialist

## 2014-08-29 ENCOUNTER — Inpatient Hospital Stay
Admission: EM | Admit: 2014-08-29 | Discharge: 2014-09-06 | DRG: 271 | Disposition: A | Discharge status: Home / Self Care | Payer: Medicare Other | Attending: Internal Medicine | Admitting: Internal Medicine

## 2014-08-29 ENCOUNTER — Inpatient Hospital Stay: Admit: 2014-08-29 | Payer: Self-pay | Admitting: Surgery

## 2014-08-29 DIAGNOSIS — Z880 Allergy status to penicillin: Secondary | ICD-10-CM

## 2014-08-29 DIAGNOSIS — Z9851 Tubal ligation status: Secondary | ICD-10-CM | POA: Diagnosis not present

## 2014-08-29 DIAGNOSIS — Z882 Allergy status to sulfonamides status: Secondary | ICD-10-CM

## 2014-08-29 DIAGNOSIS — Z801 Family history of malignant neoplasm of trachea, bronchus and lung: Secondary | ICD-10-CM

## 2014-08-29 DIAGNOSIS — D696 Thrombocytopenia, unspecified: Secondary | ICD-10-CM | POA: Diagnosis present

## 2014-08-29 DIAGNOSIS — K5900 Constipation, unspecified: Secondary | ICD-10-CM | POA: Diagnosis present

## 2014-08-29 DIAGNOSIS — E876 Hypokalemia: Secondary | ICD-10-CM | POA: Diagnosis present

## 2014-08-29 DIAGNOSIS — I7419 Embolism and thrombosis of other parts of aorta: Principal | ICD-10-CM | POA: Diagnosis present

## 2014-08-29 DIAGNOSIS — I4891 Unspecified atrial fibrillation: Secondary | ICD-10-CM | POA: Diagnosis not present

## 2014-08-29 DIAGNOSIS — I350 Nonrheumatic aortic (valve) stenosis: Secondary | ICD-10-CM | POA: Diagnosis not present

## 2014-08-29 DIAGNOSIS — M549 Dorsalgia, unspecified: Secondary | ICD-10-CM | POA: Diagnosis present

## 2014-08-29 DIAGNOSIS — I6789 Other cerebrovascular disease: Secondary | ICD-10-CM | POA: Diagnosis not present

## 2014-08-29 DIAGNOSIS — R197 Diarrhea, unspecified: Secondary | ICD-10-CM | POA: Diagnosis present

## 2014-08-29 DIAGNOSIS — K219 Gastro-esophageal reflux disease without esophagitis: Secondary | ICD-10-CM | POA: Diagnosis present

## 2014-08-29 DIAGNOSIS — Z9049 Acquired absence of other specified parts of digestive tract: Secondary | ICD-10-CM | POA: Diagnosis present

## 2014-08-29 DIAGNOSIS — D62 Acute posthemorrhagic anemia: Secondary | ICD-10-CM | POA: Diagnosis present

## 2014-08-29 DIAGNOSIS — I248 Other forms of acute ischemic heart disease: Secondary | ICD-10-CM | POA: Diagnosis present

## 2014-08-29 DIAGNOSIS — I7410 Embolism and thrombosis of unspecified parts of aorta: Secondary | ICD-10-CM

## 2014-08-29 DIAGNOSIS — I700 Atherosclerosis of aorta: Secondary | ICD-10-CM

## 2014-08-29 DIAGNOSIS — I739 Peripheral vascular disease, unspecified: Secondary | ICD-10-CM | POA: Diagnosis present

## 2014-08-29 DIAGNOSIS — N289 Disorder of kidney and ureter, unspecified: Secondary | ICD-10-CM | POA: Diagnosis present

## 2014-08-29 DIAGNOSIS — M1990 Unspecified osteoarthritis, unspecified site: Secondary | ICD-10-CM | POA: Diagnosis present

## 2014-08-29 DIAGNOSIS — E785 Hyperlipidemia, unspecified: Secondary | ICD-10-CM | POA: Diagnosis present

## 2014-08-29 DIAGNOSIS — I10 Essential (primary) hypertension: Secondary | ICD-10-CM | POA: Diagnosis present

## 2014-08-29 DIAGNOSIS — I482 Chronic atrial fibrillation: Secondary | ICD-10-CM | POA: Diagnosis present

## 2014-08-29 DIAGNOSIS — Z9889 Other specified postprocedural states: Secondary | ICD-10-CM

## 2014-08-29 DIAGNOSIS — H409 Unspecified glaucoma: Secondary | ICD-10-CM | POA: Diagnosis present

## 2014-08-29 DIAGNOSIS — R109 Unspecified abdominal pain: Secondary | ICD-10-CM | POA: Diagnosis present

## 2014-08-29 DIAGNOSIS — R200 Anesthesia of skin: Secondary | ICD-10-CM | POA: Diagnosis not present

## 2014-08-29 DIAGNOSIS — I2510 Atherosclerotic heart disease of native coronary artery without angina pectoris: Secondary | ICD-10-CM | POA: Diagnosis not present

## 2014-08-29 DIAGNOSIS — E780 Pure hypercholesterolemia: Secondary | ICD-10-CM | POA: Diagnosis present

## 2014-08-29 DIAGNOSIS — Z888 Allergy status to other drugs, medicaments and biological substances status: Secondary | ICD-10-CM

## 2014-08-29 DIAGNOSIS — Z90710 Acquired absence of both cervix and uterus: Secondary | ICD-10-CM | POA: Diagnosis not present

## 2014-08-29 DIAGNOSIS — E860 Dehydration: Secondary | ICD-10-CM | POA: Diagnosis present

## 2014-08-29 DIAGNOSIS — R001 Bradycardia, unspecified: Secondary | ICD-10-CM | POA: Diagnosis present

## 2014-08-29 DIAGNOSIS — D649 Anemia, unspecified: Secondary | ICD-10-CM | POA: Diagnosis not present

## 2014-08-29 DIAGNOSIS — I7092 Chronic total occlusion of artery of the extremities: Secondary | ICD-10-CM | POA: Diagnosis not present

## 2014-08-29 DIAGNOSIS — Z01810 Encounter for preprocedural cardiovascular examination: Secondary | ICD-10-CM | POA: Diagnosis not present

## 2014-08-29 DIAGNOSIS — I743 Embolism and thrombosis of arteries of the lower extremities: Secondary | ICD-10-CM | POA: Diagnosis not present

## 2014-08-29 DIAGNOSIS — I745 Embolism and thrombosis of iliac artery: Secondary | ICD-10-CM | POA: Diagnosis not present

## 2014-08-29 HISTORY — DX: Hyperlipidemia, unspecified: E78.5

## 2014-08-29 HISTORY — DX: Gastro-esophageal reflux disease without esophagitis: K21.9

## 2014-08-29 HISTORY — PX: FEMORAL-FEMORAL BYPASS GRAFT: SHX936

## 2014-08-29 HISTORY — DX: Unspecified atrial fibrillation: I48.91

## 2014-08-29 HISTORY — DX: Peripheral vascular disease, unspecified: I73.9

## 2014-08-29 HISTORY — DX: Unspecified glaucoma: H40.9

## 2014-08-29 LAB — CBC WITH DIFFERENTIAL/PLATELET
Basophils Absolute: 0 10*3/uL (ref 0–0.1)
Basophils Relative: 1 %
Eosinophils Absolute: 0.1 10*3/uL (ref 0–0.7)
Eosinophils Relative: 1 %
HCT: 33.3 % — ABNORMAL LOW (ref 35.0–47.0)
Hemoglobin: 10.8 g/dL — ABNORMAL LOW (ref 12.0–16.0)
LYMPHS ABS: 0.7 10*3/uL — AB (ref 1.0–3.6)
Lymphocytes Relative: 11 %
MCH: 27.7 pg (ref 26.0–34.0)
MCHC: 32.5 g/dL (ref 32.0–36.0)
MCV: 85.2 fL (ref 80.0–100.0)
Monocytes Absolute: 0.6 10*3/uL (ref 0.2–0.9)
Monocytes Relative: 9 %
NEUTROS PCT: 78 %
Neutro Abs: 5.2 10*3/uL (ref 1.4–6.5)
Platelets: 132 10*3/uL — ABNORMAL LOW (ref 150–440)
RBC: 3.91 MIL/uL (ref 3.80–5.20)
RDW: 16.2 % — AB (ref 11.5–14.5)
WBC: 6.6 10*3/uL (ref 3.6–11.0)

## 2014-08-29 LAB — ABO/RH: ABO/RH(D): A NEG

## 2014-08-29 LAB — COMPREHENSIVE METABOLIC PANEL
ALT: 26 U/L (ref 14–54)
ANION GAP: 11 (ref 5–15)
AST: 31 U/L (ref 15–41)
Albumin: 3.5 g/dL (ref 3.5–5.0)
Alkaline Phosphatase: 79 U/L (ref 38–126)
BUN: 30 mg/dL — ABNORMAL HIGH (ref 6–20)
CHLORIDE: 102 mmol/L (ref 101–111)
CO2: 25 mmol/L (ref 22–32)
Calcium: 9.1 mg/dL (ref 8.9–10.3)
Creatinine, Ser: 1.37 mg/dL — ABNORMAL HIGH (ref 0.44–1.00)
GFR calc non Af Amer: 33 mL/min — ABNORMAL LOW (ref >60)
GFR, EST AFRICAN AMERICAN: 38 mL/min — AB (ref >60)
Glucose, Bld: 114 mg/dL — ABNORMAL HIGH (ref 65–99)
POTASSIUM: 3.4 mmol/L — AB (ref 3.5–5.1)
SODIUM: 138 mmol/L (ref 135–145)
Total Bilirubin: 0.8 mg/dL (ref 0.3–1.2)
Total Protein: 5.9 g/dL — ABNORMAL LOW (ref 6.5–8.1)

## 2014-08-29 LAB — APTT: APTT: 27 s (ref 24–36)

## 2014-08-29 LAB — TYPE AND SCREEN
ABO/RH(D): A NEG
Antibody Screen: NEGATIVE

## 2014-08-29 LAB — PROTIME-INR
INR: 1.11
PROTHROMBIN TIME: 14.5 s (ref 11.4–15.0)

## 2014-08-29 LAB — MAGNESIUM: Magnesium: 1.8 mg/dL (ref 1.7–2.4)

## 2014-08-29 LAB — TROPONIN I: TROPONIN I: 0.11 ng/mL — AB (ref <0.031)

## 2014-08-29 SURGERY — CREATION, BYPASS, ARTERIAL, FEMORAL TO FEMORAL, USING GRAFT
Anesthesia: General | Laterality: Bilateral | Wound class: Clean

## 2014-08-29 MED ORDER — IOHEXOL 350 MG/ML SOLN
100.0000 mL | Freq: Once | INTRAVENOUS | Status: AC | PRN
  Administered 2014-08-29: 100 mL via INTRAVENOUS

## 2014-08-29 MED ORDER — MORPHINE SULFATE 4 MG/ML IJ SOLN
INTRAMUSCULAR | Status: AC
  Administered 2014-08-29: 4 mg via INTRAVENOUS
  Filled 2014-08-29: qty 1

## 2014-08-29 MED ORDER — HEPARIN SODIUM (PORCINE) 5000 UNIT/ML IJ SOLN
INTRAMUSCULAR | Status: AC
  Filled 2014-08-29: qty 1

## 2014-08-29 MED ORDER — HEPARIN SODIUM (PORCINE) 1000 UNIT/ML IJ SOLN
INTRAMUSCULAR | Status: DC | PRN
  Administered 2014-08-29: 2000 [IU] via INTRAVENOUS

## 2014-08-29 MED ORDER — SODIUM CHLORIDE 0.9 % IV SOLN
INTRAVENOUS | Status: DC | PRN
  Administered 2014-08-29: 21:00:00 via INTRAVENOUS

## 2014-08-29 MED ORDER — SUCCINYLCHOLINE CHLORIDE 20 MG/ML IJ SOLN
INTRAMUSCULAR | Status: DC | PRN
  Administered 2014-08-29: 80 mg via INTRAVENOUS

## 2014-08-29 MED ORDER — HEPARIN BOLUS VIA INFUSION
4000.0000 [IU] | Freq: Once | INTRAVENOUS | Status: AC
  Administered 2014-08-29: 4000 [IU] via INTRAVENOUS
  Filled 2014-08-29: qty 4000

## 2014-08-29 MED ORDER — MORPHINE SULFATE 4 MG/ML IJ SOLN
4.0000 mg | Freq: Once | INTRAMUSCULAR | Status: AC
Start: 2014-08-29 — End: 2014-08-29
  Administered 2014-08-29: 4 mg via INTRAVENOUS

## 2014-08-29 MED ORDER — ESMOLOL HCL-SODIUM CHLORIDE 2000 MG/100ML IV SOLN: 25.0000 ug/kg/min | Freq: Once | INTRAVENOUS | Status: DC

## 2014-08-29 MED ORDER — MORPHINE SULFATE 4 MG/ML IJ SOLN
4.0000 mg | Freq: Once | INTRAMUSCULAR | Status: AC
  Administered 2014-08-29: 4 mg via INTRAVENOUS

## 2014-08-29 MED ORDER — LIDOCAINE HCL (CARDIAC) 20 MG/ML IV SOLN
INTRAVENOUS | Status: DC | PRN
  Administered 2014-08-29: 80 mg via INTRAVENOUS

## 2014-08-29 MED ORDER — HEPARIN (PORCINE) IN NACL 100-0.45 UNIT/ML-% IJ SOLN
800.0000 [IU]/h | INTRAMUSCULAR | Status: DC
  Administered 2014-08-29: 800 [IU]/h via INTRAVENOUS
  Filled 2014-08-29: qty 250

## 2014-08-29 MED ORDER — ONDANSETRON HCL 4 MG/2ML IJ SOLN
4.0000 mg | Freq: Once | INTRAMUSCULAR | Status: AC
  Administered 2014-08-29: 4 mg via INTRAVENOUS

## 2014-08-29 MED ORDER — PHENYLEPHRINE HCL 10 MG/ML IJ SOLN
INTRAMUSCULAR | Status: DC | PRN
  Administered 2014-08-29: 100 ug via INTRAVENOUS

## 2014-08-29 MED ORDER — VANCOMYCIN HCL 1000 MG IV SOLR
INTRAVENOUS | Status: AC
  Filled 2014-08-29: qty 1000

## 2014-08-29 MED ORDER — PROPOFOL 10 MG/ML IV BOLUS
INTRAVENOUS | Status: DC | PRN
  Administered 2014-08-29: 130 mg via INTRAVENOUS

## 2014-08-29 MED ORDER — LACTATED RINGERS IV SOLN
INTRAVENOUS | Status: DC | PRN
  Administered 2014-08-29 – 2014-08-30 (×2): via INTRAVENOUS

## 2014-08-29 MED ORDER — FENTANYL CITRATE (PF) 100 MCG/2ML IJ SOLN
INTRAMUSCULAR | Status: DC | PRN
  Administered 2014-08-29: 100 ug via INTRAVENOUS
  Administered 2014-08-30: 50 ug via INTRAVENOUS

## 2014-08-29 MED ORDER — ONDANSETRON HCL 4 MG/2ML IJ SOLN
INTRAMUSCULAR | Status: AC
Start: 2014-08-29 — End: 2014-08-29
  Administered 2014-08-29: 4 mg via INTRAVENOUS
  Filled 2014-08-29: qty 2

## 2014-08-29 MED ORDER — SODIUM CHLORIDE 0.9 % IV BOLUS (SEPSIS)
500.0000 mL | INTRAVENOUS | Status: AC
  Administered 2014-08-29: 500 mL via INTRAVENOUS

## 2014-08-29 MED ORDER — THROMBIN 5000 UNITS EX SOLR
CUTANEOUS | Status: AC
  Filled 2014-08-29: qty 5000

## 2014-08-29 MED ORDER — VANCOMYCIN HCL 1000 MG IV SOLR
1000.0000 mg | INTRAVENOUS | Status: DC | PRN
  Administered 2014-08-29: 1000 mg via INTRAVENOUS

## 2014-08-29 MED ORDER — ROCURONIUM BROMIDE 100 MG/10ML IV SOLN
INTRAVENOUS | Status: DC | PRN
Start: 2014-08-29 — End: 2014-08-30
  Administered 2014-08-29: 30 mg via INTRAVENOUS
  Administered 2014-08-29: 20 mg via INTRAVENOUS

## 2014-08-29 SURGICAL SUPPLY — 62 items
ADH SKN CLS APL DERMABOND .7 (GAUZE/BANDAGES/DRESSINGS) ×1
APPLIER CLIP 11 MED OPEN (CLIP) ×3
APPLIER CLIP 9.375 SM OPEN (CLIP) ×3
APR CLP MED 11 20 MLT OPN (CLIP) ×1
APR CLP SM 9.3 20 MLT OPN (CLIP) ×1
BAG DECANTER STRL (MISCELLANEOUS) ×2 IMPLANT
BAG ISL LRG 20X20 DRWSTRG (DRAPES) ×2
BAG ISOLATATION DRAPE 20X20 ST (DRAPES) IMPLANT
BLADE SURG 15 STRL LF DISP TIS (BLADE) IMPLANT
BLADE SURG 15 STRL SS (BLADE) ×3
BLADE SURG SZ11 CARB STEEL (BLADE) ×2 IMPLANT
BOOT SUTURE AID YELLOW STND (SUTURE) ×2 IMPLANT
CANISTER SUCT 1200ML W/VALVE (MISCELLANEOUS) ×2 IMPLANT
CATH EMBOLECTOMY 5FR (BALLOONS) ×4 IMPLANT
CATH TRAY 16F METER LATEX (MISCELLANEOUS) ×2 IMPLANT
CHLORAPREP W/TINT 26ML (MISCELLANEOUS) ×6 IMPLANT
CLIP APPLIE 11 MED OPEN (CLIP) IMPLANT
CLIP APPLIE 9.375 SM OPEN (CLIP) IMPLANT
DERMABOND ADVANCED (GAUZE/BANDAGES/DRESSINGS) ×2
DERMABOND ADVANCED .7 DNX12 (GAUZE/BANDAGES/DRESSINGS) IMPLANT
DRAPE ISOLATE BAG 20X20 STRL (DRAPES) ×4
DRAPE SHEET LG 3/4 BI-LAMINATE (DRAPES) ×4 IMPLANT
DRESSING SURGICEL FIBRLLR 1X2 (HEMOSTASIS) IMPLANT
DRSG SURGICEL FIBRILLAR 1X2 (HEMOSTASIS) ×6
ELECT CAUTERY BLADE 6.4 (BLADE) ×2 IMPLANT
GLOVE INDICATOR 7.0 STRL GRN (GLOVE) ×2 IMPLANT
GLOVE SKINSENSE NS SZ7.0 (GLOVE) ×2
GLOVE SKINSENSE STRL SZ7.0 (GLOVE) IMPLANT
GLOVE SURG SYN 7.5  E (GLOVE) ×2
GLOVE SURG SYN 7.5 E (GLOVE) ×1 IMPLANT
GLOVE SURG SYN 7.5 PF PI (GLOVE) IMPLANT
GOWN STRL REUS W/ TWL LRG LVL3 (GOWN DISPOSABLE) IMPLANT
GOWN STRL REUS W/TWL LRG LVL3 (GOWN DISPOSABLE) ×6
HEMOSTAT SURGICEL 2X3 (HEMOSTASIS) IMPLANT
IV NS 500ML (IV SOLUTION) ×3
IV NS 500ML BAXH (IV SOLUTION) IMPLANT
LABEL OR SOLS (LABEL) ×2 IMPLANT
LIQUID BAND (GAUZE/BANDAGES/DRESSINGS) ×4 IMPLANT
LOOP RED MAXI  1X406MM (MISCELLANEOUS) ×2
LOOP VESSEL MAXI 1X406 RED (MISCELLANEOUS) IMPLANT
LOOP VESSEL MINI 0.8X406 BLUE (MISCELLANEOUS) IMPLANT
LOOPS BLUE MINI 0.8X406MM (MISCELLANEOUS) ×4
NDL FILTER BLUNT 18X1 1/2 (NEEDLE) IMPLANT
NEEDLE FILTER BLUNT 18X 1/2SAF (NEEDLE) ×2
NEEDLE FILTER BLUNT 18X1 1/2 (NEEDLE) ×1 IMPLANT
NS IRRIG 500ML POUR BTL (IV SOLUTION) ×2 IMPLANT
PACK BASIN MAJOR ARMC (MISCELLANEOUS) ×6 IMPLANT
PACK UNIVERSAL (MISCELLANEOUS) ×2 IMPLANT
PAD GROUND ADULT SPLIT (MISCELLANEOUS) ×2 IMPLANT
PENCIL ELECTRO HAND CTR (MISCELLANEOUS) ×2 IMPLANT
SPONGE LAP 18X18 5 PK (GAUZE/BANDAGES/DRESSINGS) ×2 IMPLANT
SPONGE XRAY 4X4 16PLY STRL (MISCELLANEOUS) ×2 IMPLANT
SUT PROLENE 5 0 RB 1 DA (SUTURE) ×8 IMPLANT
SUT SILK 2 0 (SUTURE) ×3
SUT SILK 2-0 18XBRD TIE 12 (SUTURE) IMPLANT
SUT VIC AB 2-0 CT1 (SUTURE) ×4 IMPLANT
SUT VIC AB 3-0 SH 27 (SUTURE) ×6
SUT VIC AB 3-0 SH 27X BRD (SUTURE) IMPLANT
SYR 20CC LL (SYRINGE) ×2 IMPLANT
SYR 3ML LL SCALE MARK (SYRINGE) ×2 IMPLANT
SYR BULB IRRIG 60ML STRL (SYRINGE) ×2 IMPLANT
TAPE UMBIL 1/8X18 RADIOPA (MISCELLANEOUS) IMPLANT

## 2014-08-29 NOTE — ED Provider Notes (Signed)
[facility] Emergency Department Provider Note  ____________________________________________  Time seen: Approximately 7824 PM  I have reviewed the triage vital signs and the nursing notes.   HISTORY  Chief Complaint Back Pain   HPI Lori Roberts is a 81 y.o. female presents to the ER for the complaint of mid back pain since 12 PM this afternoon. Patient states that she had a normal morning with no injury or fall. Patient states she was at rest when onset of mid back pain. Patient states that may back pain is persistent as well as noticing bilateral lower leg tingling and numbness sensation. His back pain at this time is 6 out of 10.  Denies chest pain, shortness of breath, nausea vomiting diarrhea or headache. Denies weakness. Denies other numbness or tingling sensations.  Patient also reports that she was recently seen in the hospital on 08/17/2014 for diarrhea and abdominal pain. Patient states at that time they told her that she had blood in her stool and she was stopped off of her Coumadin. Patient states that she hasn't seen her primary doctor Dr. Rebecka Apley and has not been restarted on her Coumadin. She states that she took all of her blood pressure and other medications today as normal.  Past Medical History  Diagnosis Date  . Heart trouble   . Arthritis   . HBP (high blood pressure)    atrial fibrillation  There are no active problems to display for this patient.   Past Surgical History  Procedure Laterality Date  . Abdominal hysterectomy    . Kidney stent    . Cerebral aneurysm repair    . Dilation and curettage of uterus    . Appendectomy    . Tubal ligation      Current Outpatient Rx  Name  Route  Sig  Dispense  Refill  . amLODipine (NORVASC) 5 MG tablet               . atorvastatin (LIPITOR) 20 MG tablet               . BENICAR 40 MG tablet               . bumetanide (BUMEX) 0.5 MG tablet               .  clopidogrel (PLAVIX) 75 MG tablet               . dorzolamide-timolol (COSOPT) 22.3-6.8 MG/ML ophthalmic solution               . hydrALAZINE (APRESOLINE) 25 MG tablet               . isosorbide mononitrate (IMDUR) 30 MG 24 hr tablet               . loperamide (IMODIUM) 2 MG capsule   Oral   Take 1 capsule (2 mg total) by mouth as needed for diarrhea or loose stools.   30 capsule   0   . metroNIDAZOLE (FLAGYL) 500 MG tablet   Oral   Take 1 tablet (500 mg total) by mouth 3 (three) times daily.   30 tablet   0   . pantoprazole (PROTONIX) 40 MG tablet   Oral   Take 1 tablet (40 mg total) by mouth daily.   30 tablet   1   . sotalol (BETAPACE) 120 MG tablet               . TRAVATAN  Z 0.004 % SOLN ophthalmic solution               . VESICARE 5 MG tablet               . warfarin (COUMADIN) 5 MG tablet                 Allergies Wool alcohol; Azor; Clindamycin/lincomycin; Iodine; Mebaral; Penicillins; and Sulfa antibiotics  No family history on file.  Social History History  Substance Use Topics  . Smoking status: Never Smoker   . Smokeless tobacco: Never Used  . Alcohol Use: Yes     Comment: WEEKLY    Review of Systems Constitutional: No fever/chills Eyes: No visual changes. ENT: No sore throat. Cardiovascular: Denies chest pain. Respiratory: Denies shortness of breath. Gastrointestinal: No abdominal pain.  No nausea, no vomiting.  No diarrhea.  No constipation. Genitourinary: Negative for dysuria. Musculoskeletal: Positive for back pain. And for decreased sensation to bilateral legs per patient. Skin: Negative for rash. Neurological: Negative for headaches, focal weakness or numbness.  10-point ROS otherwise negative.  ____________________________________________   PHYSICAL EXAM:  VITAL SIGNS: ED Triage Vitals  Enc Vitals Group     BP 08/29/14 1702 205/112 mmHg     Pulse Rate 08/29/14 1702 104     Resp 08/29/14 1702 18      Temp 08/29/14 1702 98.6 F (37 C)     Temp Source 08/29/14 1702 Oral     SpO2 08/29/14 1702 97 %     Weight 08/29/14 1702 148 lb (67.132 kg)     Height 08/29/14 1702 5\' 5"  (1.651 m)     Head Cir --      Peak Flow --      Pain Score 08/29/14 1703 8     Pain Loc --      Pain Edu? --      Excl. in [place]   08/29/14 1930 08/29/14 1942 08/29/14 1953 08/29/14 1953  BP: 144/119   144/119  Pulse:    99  Temp:      TempSrc:      Resp: 23   18  Height:      Weight:      SpO2:    97%  PainSc:  8  8  8      Constitutional: Alert and oriented. Well appearing and in no acute distress. Eyes: Conjunctivae are normal. PERRL. EOMI. Head: Atraumatic. Nose: No congestion/rhinnorhea. Mouth/Throat: Mucous membranes are moist.  Oropharynx non-erythematous. Neck: No stridor.  No cervical spine tenderness to palpation. Hematological/Lymphatic/Immunilogical: No cervical lymphadenopathy. Cardiovascular: Irregularly irregular Grossly normal heart sounds.   Respiratory: Normal respiratory effort.  No retractions. Lungs CTAB. Gastrointestinal: Soft. Generalized mild to moderate abdominal pain to palpation. No guarding. No distention. No abdominal bruits. No CVA tenderness. Musculoskeletal: No cervical, thoracic or lumbar tenderness to palpation. Back pain nonreproducible. Bilateral straight leg raise negative. Bilateral lower extremities decreased sensation, but with full range of motion. Bilateral pedal pulses unable to palpate. Also unable to locate pedal pulses on bedside Doppler as well. Cap refill sluggish and approx 4-5 seconds. Bilateral lower extremities with pallor.  Bilateral upper extremities with Full ROM and equal sensation bilaterally. Distal radial pulses equal bilaterally.  Neurologic:  Normal speech and language. No gross focal neurologic deficits are appreciated. Speech is normal.  Skin:  Skin is warm, dry and intact. No rash noted. Psychiatric: Mood and affect are  normal. Speech and behavior are normal.  ____________________________________________   LABS (all labs ordered are listed, but only abnormal results are displayed)  Labs Reviewed  CBC WITH DIFFERENTIAL/PLATELET - Abnormal; Notable for the following:    Hemoglobin 10.8 (*)    HCT 33.3 (*)    RDW 16.2 (*)    Platelets 132 (*)    Lymphs Abs 0.7 (*)    All other components within normal limits  COMPREHENSIVE METABOLIC PANEL - Abnormal; Notable for the following:    Potassium 3.4 (*)    Glucose, Bld 114 (*)    BUN 30 (*)    Creatinine, Ser 1.37 (*)    Total Protein 5.9 (*)    GFR calc non Af Amer 33 (*)    GFR calc Af Amer 38 (*)    All other components within normal limits  TROPONIN I - Abnormal; Notable for the following:    Troponin I 0.11 (*)    All other components within normal limits  PROTIME-INR  APTT  MAGNESIUM  TYPE AND SCREEN   ____________________________________________  EKG  _______________________________________  RADIOLOGY  CT ANGIOGRAPHY CHEST, ABDOMEN AND PELVIS  TECHNIQUE: Multidetector CT imaging through the chest, abdomen and pelvis was performed using the standard protocol during bolus administration of intravenous contrast. Multiplanar reconstructed images and MIPs were obtained and reviewed to evaluate the vascular anatomy.  CONTRAST: 166mL OMNIPAQUE IOHEXOL 350 MG/ML SOLN  COMPARISON: None.  FINDINGS: CTA CHEST FINDINGS  Small right-sided pleural effusion is noted. The lungs are well aerated without focal infiltrate. Biapical scarring is seen.  The thoracic inlet shows evidence of bilateral thyroid nodules. The thoracic aorta and its branches are within normal limits. The left vertebral artery arises directly from the aorta. Diffuse atherosclerotic changes and coronary calcifications are seen without evidence of dissection or aneurysmal dilatation. The degree of atherosclerotic disease is increased in the descending thoracic aorta.  Pulmonary artery is within normal limits. No findings to suggest pulmonary emboli are seen.  Review of the MIP images confirms the above findings.  CTA ABDOMEN AND PELVIS FINDINGS  The liver, gallbladder, spleen, adrenal glands and pancreas are within normal limits. The kidneys are well visualized bilaterally. Renal cystic changes noted on the left. No renal calculi or obstructive changes are seen. The ureters are within normal limits to the urinary bladder. The appendix is not visualize consistent with surgical history. Diverticulosis without diverticulitis is noted. The bladder is well distended. No pelvic mass lesion or sidewall abnormality is noted.  Vascular: The abdominal aorta demonstrates atherosclerotic changes with only mild ectasia in a maximal dimension of 2.7 cm. The celiac axis and superior mesenteric artery demonstrate atherosclerotic disease proximally with mild stenosis identified. The inferior mesenteric artery is occluded at its origin. Single renal is identified identified on the right. A right renal artery stent is seen which appears widely patent. Dual renal arteries are noted on the left. Atherosclerotic changes are noted in the main renal artery on the left.  Approximately 5 cm below the renal arteries there is evidence of occlusion of the distal abdominal aorta. No patent dissection is noted. The occlusion extends into the left common iliac artery to the level of the iliac bifurcation. On the right common it extends to the level of the distal right common iliac artery. More distal external iliac arteries and common femoral arteries are patent fed via collaterals predominately from the deep circumflex iliac arteries bilaterally. The internal iliac artery on the right is occluded proximally although distally is opacified by pelvic collaterals. The internal iliac  artery on the left is within normal limits. Some filling defects are noted within the profunda  femoral branches on the right consistent with emboli.  The bony structures are within normal limits.  Review of the MIP images confirms the above findings.  IMPRESSION: Distal aortic occlusion extending into the iliac arteries bilaterally. There also all filling defects within the right profunda femoral arterial branches as well as occlusion at the origin of the right internal iliac artery. These changes may be related to massive distal embolism with subsequent aortic occlusion. By history there are absent pedal pulses which would also suggest some more distal showering of emboli.  Distal thoracic aortic atherosclerotic change.  Patent right renal stent.  These results were called by telephone at the time of interpretation on 08/29/2014 at 7:26 pm to Dr. Hinda Kehr , who verbally acknowledged these results.   Electronically Signed By: Inez Catalina M.D. On: 08/29/2014 19:26       ______________________________________________________________________________________   INITIAL IMPRESSION / ASSESSMENT AND PLAN / ED COURSE  Pertinent labs & imaging results that were available during my care of the patient were reviewed by me and considered in my medical decision making (see chart for details).  Alert and oriented, well-appearing patient. Presents to the ER for the complaint of nonreproducible posterior mid back pain complaints and bilateral leg numbness since 12pm today. On exam patient with generalized abdominal tenderness, nonreproducible back pain, and nonpalpable pedal pulses with decreased sensation to bilateral lower extremities. Immediate concern rule out aortic dissection. Will obtain stat CT scan.  CT results called to Dr. Karma Greaser. Ct scan revealing distal aortic occlusion. Dr. Karma Greaser also spoke to vascular surgeon Dr. Anette Riedel additional charting from Dr Karma Greaser. Dr Trula Slade to admit pt to surgery. Pt verbalized understanding.   Nurse at bedside with patient,  trying  to reach patient  family members by phone.  ____________________________________________   FINAL CLINICAL IMPRESSION(S) / ED DIAGNOSES  Final diagnoses:  Aortic occlusion      Marylene Land, NP 08/29/14 2009  Marylene Land, NP 08/29/14 3825  Hinda Kehr, MD 08/29/14 [phone]

## 2014-08-29 NOTE — H&P (Signed)
History and Physical  Patient name: Lori Roberts MRN: [id number] DOB: 1921/11/13 Sex: female   Reason for Admission:  Chief Complaint  Patient presents with  . Back Pain    HISTORY OF PRESENT ILLNESS: This is a 81 year old female who presented to the emergency department after complaining of back pain around 12:30 this afternoon.  Shortly thereafter she could not move her legs.  She underwent CT angiography which revealed acute aortic occlusion.  She states that she cannot move or feel her legs.  The patient lives at home by herself and is ambulatory.  She has a history of a brain aneurysm in her 42s.  She was on anticoagulation for atrial fibrillation, however this was discontinued approximately 3 weeks ago when she came in with diarrhea which was heme positive.  She has not taken any Coumadin since that time.  She also has a history of a renal stent.  Her hypertension is managed medically.  She is a nonsmoker she takes medication for reflux disease.  She has hypercholesterolemia but does not take a statin.  Past Medical History  Diagnosis Date  . Heart trouble   . Arthritis   . HBP (high blood pressure)   . Atrial fibrillation   . PVD (peripheral vascular disease)   . Glaucoma   . Hyperlipidemia   . GERD (gastroesophageal reflux disease)     Past Surgical History  Procedure Laterality Date  . Abdominal hysterectomy    . Kidney stent    . Cerebral aneurysm repair    . Dilation and curettage of uterus    . Appendectomy    . Tubal ligation      History   Social History  . Marital Status: Widowed    Spouse Name: N/A  . Number of Children: N/A  . Years of Education: N/A   Occupational History  . Not on file.   Social History Main Topics  . Smoking status: Never Smoker   . Smokeless tobacco: Never Used  . Alcohol Use: Yes     Comment: WEEKLY  . Drug Use: No  . Sexual Activity: Not on file   Other Topics Concern  . Not on file   Social History  Narrative    Family History  Problem Relation Age of Onset  . Lung cancer Mother   . Cerebral aneurysm Father     Allergies as of 08/29/2014 - Review Complete 08/29/2014  Allergen Reaction Noted  . Wool alcohol [lanolin] Swelling 01/23/2013  . Azor [amlodipine-olmesartan] Rash 01/23/2013  . Clindamycin/lincomycin Rash 01/23/2013  . Iodine Rash 08/29/2014  . Mebaral [mephobarbital] Rash 01/23/2013  . Penicillins Rash 01/23/2013  . Sulfa antibiotics Rash 01/23/2013    No current facility-administered medications on file prior to encounter.   Current Outpatient Prescriptions on File Prior to Encounter  Medication Sig Dispense Refill  . loperamide (IMODIUM) 2 MG capsule Take 1 capsule (2 mg total) by mouth as needed for diarrhea or loose stools. 30 capsule 0  . metroNIDAZOLE (FLAGYL) 500 MG tablet Take 1 tablet (500 mg total) by mouth 3 (three) times daily. 30 tablet 0  . pantoprazole (PROTONIX) 40 MG tablet Take 1 tablet (40 mg total) by mouth daily. 30 tablet 1     REVIEW OF SYSTEMS: IIIc history of present illness, otherwise negative  PHYSICAL EXAMINATION: General: The patient appears their stated age.  Vital signs are BP 144/119 mmHg  Pulse 99  Temp(Src) 98.6 F (37 C) (Oral)  Resp 18  Ht 5\' 5"  (1.651 m)  Wt 148 lb (67.132 kg)  BMI 24.63 kg/m2  SpO2 97% HEENT:  No gross abnormalities Pulmonary: Respirations are non-labored Abdomen: Soft and non-tender with. Musculoskeletal: There are no major deformities.   Neurologic: No sensation in either lower extremities.  She has movement of her right ankle but cannot move her right toes.  She can move her left toes. Skin: There are no ulcer or rashes noted. Psychiatric: The patient has normal affect. Cardiovascular: Irregular rate.  Nonpalpable pedal or femoral pulses  Diagnostic Studies: I have reviewed her CT angiogram which reveals aortic occlusion.    Assessment:  Acute aortic occlusion Plan: The patient has been  started on a heparin drip.  She needs emergent operative intervention.  I discussed potentially doing this through bilateral femoral embolectomies.  I discussed the possibility that she may require an axillary bifemoral bypass graft.  I also discussed that she may require fasciotomies if she develops a compartment syndrome.  I feel without surgical intervention that she will end up with bilateral amputation.  The patient wishes to proceed.     Eldridge Abrahams, M.D. [facility] Office: [phone] Pager:  [phone]

## 2014-08-29 NOTE — ED Notes (Signed)
Pt. Voided in bedpan without difficulty.

## 2014-08-29 NOTE — Progress Notes (Signed)
ANTICOAGULATION CONSULT NOTE - Initial Consult  Pharmacy Consult for Heparin Indication: complete aortic occlusion  Allergies  Allergen Reactions  . Wool Alcohol [Lanolin] Swelling  . Azor [Amlodipine-Olmesartan] Rash  . Clindamycin/Lincomycin Rash  . Iodine Rash  . Mebaral [Mephobarbital] Rash  . Penicillins Rash  . Sulfa Antibiotics Rash    Patient Measurements: Height: 5\' 5"  (165.1 cm) Weight: 148 lb (67.132 kg) IBW/kg (Calculated) : 57 Heparin Dosing Weight: 67.1 kg  Vital Signs: Temp: 98.6 F (37 C) (06/10 1702) Temp Source: Oral (06/10 1702) BP: 185/108 mmHg (06/10 1910) Pulse Rate: 101 (06/10 1910)  Labs:  Recent Labs  08/29/14 1843  HGB 10.8*  HCT 33.3*  PLT 132*  APTT 27  LABPROT 14.5  INR 1.11  CREATININE 1.37*  TROPONINI 0.11*    Estimated Creatinine Clearance: 24.6 mL/min (by C-G formula based on Cr of 1.37).   Medical History: Past Medical History  Diagnosis Date  . Heart trouble   . Arthritis   . HBP (high blood pressure)     Medications:   (Not in a hospital admission)  Assessment: Complete Aortic Occlusion  Goal of Therapy:  Heparin level 0.3-0.7 units/ml Monitor platelets by anticoagulation protocol: Yes   Plan:  Give 4000 units bolus x 1 Start heparin infusion at 800 units/hr  Will draw 1st HL 8 hrs after start of infusion on 6/11 @   Lori Roberts D 08/29/2014,7:44 PM

## 2014-08-29 NOTE — ED Notes (Signed)
Report given to Dorette Grate RN from [place].

## 2014-08-29 NOTE — ED Notes (Signed)
Dr. Trula Slade is at bedside, along with patient's Lori Roberts who is the patient's nephew. Dr. Trula Slade obtained OR consent.

## 2014-08-29 NOTE — Anesthesia Procedure Notes (Signed)
Procedure Name: Intubation Date/Time: 08/29/2014 9:29 PM Performed by: Doreen Salvage Pre-anesthesia Checklist: Patient identified, Emergency Drugs available, Suction available and Patient being monitored Patient Re-evaluated:Patient Re-evaluated prior to inductionOxygen Delivery Method: Circle system utilized Preoxygenation: Pre-oxygenation with 100% oxygen Intubation Type: Rapid sequence, IV induction and Cricoid Pressure applied Laryngoscope Size: Mac and 3 Grade View: Grade I Tube type: Oral Tube size: 7.0 mm Number of attempts: 1 Airway Equipment and Method: Stylet Placement Confirmation: ETT inserted through vocal cords under direct vision,  positive ETCO2 and breath sounds checked- equal and bilateral Secured at: 22 cm Tube secured with: Tape Dental Injury: Teeth and Oropharynx as per pre-operative assessment

## 2014-08-29 NOTE — Consult Note (Signed)
[facility] [place] NAME: Lori Roberts    MR#:  [id number]  DATE OF BIRTH:  1922-01-07  DATE OF ADMISSION:  08/29/2014  PRIMARY CARE PHYSICIAN: Cletis Athens, MD   REQUESTING/REFERRING PHYSICIAN: Dr. Trula Slade.    CHIEF COMPLAINT:   Chief Complaint  Patient presents with  . Back Pain    HISTOHypRY OF PRESENT ILLNESS:  Lori Roberts  is a 81 y.o. female with a known history of hypertension, chronic afibrillation, peripheral vascular disease, osteoarthritis, glaucoma, GERD, who presented to the hospital with back pain that started around noon today. Patient says then she developed some lower extremity pain and weakness with ambulation. She therefore called EMS and she was brought to the hospital. Patient underwent a CT angiography of the chest abdomen pelvis which showed an acute aortic occlusion. Hospitalist services were contacted for consultation for preoperative evaluation. Patient denies any chest pain presently, no shortness of breath, nausea vomiting.  Patient did have some diarrhea 3 weeks ago which was intermittently bloody and therefore her Coumadin was stopped. She has been off blood thinners now for about a week. Her INR today is 1.1 and she presents with the above complaint and noted to have an acute aortic occlusion.  PAST MEDICAL HISTORY:   Past Medical History  Diagnosis Date  . Heart trouble   . Arthritis   . HBP (high blood pressure)   . Atrial fibrillation   . PVD (peripheral vascular disease)   . Glaucoma   . Hyperlipidemia   . GERD (gastroesophageal reflux disease)     PAST SURGICAL HISTOIRY:   Past Surgical History  Procedure Laterality Date  . Abdominal hysterectomy    . Kidney stent    . Cerebral aneurysm repair    . Dilation and curettage of uterus    . Appendectomy    . Tubal ligation      SOCIAL HISTORY:   History  Substance Use Topics  . Smoking status: Never Smoker   . Smokeless tobacco:  Never Used  . Alcohol Use: Yes     Comment: WEEKLY    FAMILY HISTORY:   Family History  Problem Relation Age of Onset  . Lung cancer Mother   . Cerebral aneurysm Father     DRUG ALLERGIES:   Allergies  Allergen Reactions  . Wool Alcohol [Lanolin] Swelling  . Azor [Amlodipine-Olmesartan] Rash  . Clindamycin/Lincomycin Rash  . Iodine Rash  . Mebaral [Mephobarbital] Rash  . Penicillins Rash  . Sulfa Antibiotics Rash    REVIEW OF SYSTEMS:   Review of Systems  Constitutional: Negative for fever and weight loss.  HENT: Negative for congestion, nosebleeds and tinnitus.   Eyes: Negative for blurred vision, double vision and redness.  Respiratory: Negative for cough, hemoptysis and shortness of breath.   Cardiovascular: Negative for chest pain, orthopnea, leg swelling and PND.  Gastrointestinal: Negative for nausea, vomiting, abdominal pain, diarrhea and melena.  Genitourinary: Negative for dysuria, urgency and hematuria.  Musculoskeletal: Positive for back pain. Negative for joint pain and falls.  Skin: Negative for rash.  Neurological: Positive for weakness (LE weakness). Negative for dizziness, tingling, sensory change, focal weakness, seizures and headaches.  Endo/Heme/Allergies: Negative for polydipsia. Does not bruise/bleed easily.  Psychiatric/Behavioral: Negative for depression and memory loss. The patient is not nervous/anxious.      MEDICATIONS AT HOME:   Prior to Admission medications   Medication Sig Start Date End Date Taking? Authorizing Provider  amLODipine (NORVASC) 5 MG tablet  Take 5 mg by mouth daily.   Yes Historical Provider, MD  atorvastatin (LIPITOR) 20 MG tablet Take 20 mg by mouth daily.   Yes Historical Provider, MD  bumetanide (BUMEX) 0.5 MG tablet Take 0.5 mg by mouth daily.   Yes Historical Provider, MD  clopidogrel (PLAVIX) 75 MG tablet Take 75 mg by mouth daily.   Yes Historical Provider, MD  dorzolamide-timolol (COSOPT) 22.3-6.8 MG/ML  ophthalmic solution Place 1 drop into both eyes 3 (three) times daily.   Yes Historical Provider, MD  hydrALAZINE (APRESOLINE) 25 MG tablet Take 25 mg by mouth daily.   Yes Historical Provider, MD  isosorbide dinitrate (ISORDIL) 30 MG tablet Take 30 mg by mouth 3 (three) times daily.   Yes Historical Provider, MD  loperamide (IMODIUM) 2 MG capsule Take 1 capsule (2 mg total) by mouth as needed for diarrhea or loose stools. 08/17/14  Yes Earleen Newport, MD  metroNIDAZOLE (FLAGYL) 500 MG tablet Take 1 tablet (500 mg total) by mouth 3 (three) times daily. 08/17/14 08/31/14 Yes Earleen Newport, MD  olmesartan (BENICAR) 40 MG tablet Take 40 mg by mouth daily.   Yes Historical Provider, MD  pantoprazole (PROTONIX) 40 MG tablet Take 1 tablet (40 mg total) by mouth daily. 08/17/14 08/17/15 Yes Earleen Newport, MD  solifenacin (VESICARE) 5 MG tablet Take 5 mg by mouth daily.   Yes Historical Provider, MD  sotalol (BETAPACE) 120 MG tablet Take 120 mg by mouth 2 (two) times daily.   Yes Historical Provider, MD  Travoprost, BAK Free, (TRAVATAN) 0.004 % SOLN ophthalmic solution Place 1 drop into both eyes at bedtime.   Yes Historical Provider, MD  warfarin (COUMADIN) 5 MG tablet Take 5 mg by mouth daily.   Yes Historical Provider, MD      VITAL SIGNS:  Blood pressure 144/119, pulse 99, temperature 98.6 F (37 C), temperature source Oral, resp. rate 18, height 5\' 5"  (1.651 m), weight 67.132 kg (148 lb), SpO2 97 %.  PHYSICAL EXAMINATION:  GENERAL:  81 y.o.-year-old patient lying in the bed with no acute distress.  EYES: Pupils equal, round, reactive to light and accommodation. No scleral icterus. Extraocular muscles intact.  HEENT: Head atraumatic, normocephalic. Oropharynx and nasopharynx clear.  NECK:  Supple, no jugular venous distention. No thyroid enlargement, no tenderness.  LUNGS: Normal breath sounds bilaterally, no wheezing, rales, rhonchi. No use of accessory muscles of respiration.   CARDIOVASCULAR: S1, S2 normal. 2/6 systolic ejection murmur at the right sternal border. No rubs, clicks, gallops.  ABDOMEN: Soft, tender diffusely, no rebound, rigidity. nondistended. Bowel sounds present. No organomegaly or mass.  EXTREMITIES: No pedal edema, cyanosis, or clubbing.  Faint dorsalis pedis pulses bilaterally. NEUROLOGIC: Cranial nerves II through XII are intact. No focal motor or sensory deficits bilaterally. PSYCHIATRIC: The patient is alert and oriented x 3. Good affect. SKIN: No obvious rash, lesion, or ulcer.   LABORATORY PANEL:   CBC  Recent Labs Lab 08/29/14 1843  WBC 6.6  HGB 10.8*  HCT 33.3*  PLT 132*   ------------------------------------------------------------------------------------------------------------------  Chemistries   Recent Labs Lab 08/29/14 1843  NA 138  K 3.4*  CL 102  CO2 25  GLUCOSE 114*  BUN 30*  CREATININE 1.37*  CALCIUM 9.1  MG 1.8  AST 31  ALT 26  ALKPHOS 79  BILITOT 0.8   ------------------------------------------------------------------------------------------------------------------  Cardiac Enzymes  Recent Labs Lab 08/29/14 1843  TROPONINI 0.11*   ------------------------------------------------------------------------------------------------------------------  RADIOLOGY:  Ct Angio Chest Aorta W/cm &/or Wo/cm  08/29/2014   CLINICAL DATA:  Lower back pain with weakness and distal numbness, initial encounter  EXAM: CT ANGIOGRAPHY CHEST, ABDOMEN AND PELVIS  TECHNIQUE: Multidetector CT imaging through the chest, abdomen and pelvis was performed using the standard protocol during bolus administration of intravenous contrast. Multiplanar reconstructed images and MIPs were obtained and reviewed to evaluate the vascular anatomy.  CONTRAST:  160mL OMNIPAQUE IOHEXOL 350 MG/ML SOLN  COMPARISON:  None.  FINDINGS: CTA CHEST FINDINGS  Small right-sided pleural effusion is noted. The lungs are well aerated without focal  infiltrate. Biapical scarring is seen.  The thoracic inlet shows evidence of bilateral thyroid nodules. The thoracic aorta and its branches are within normal limits. The left vertebral artery arises directly from the aorta. Diffuse atherosclerotic changes and coronary calcifications are seen without evidence of dissection or aneurysmal dilatation. The degree of atherosclerotic disease is increased in the descending thoracic aorta. Pulmonary artery is within normal limits. No findings to suggest pulmonary emboli are seen.  Review of the MIP images confirms the above findings.  CTA ABDOMEN AND PELVIS FINDINGS  The liver, gallbladder, spleen, adrenal glands and pancreas are within normal limits. The kidneys are well visualized bilaterally. Renal cystic changes noted on the left. No renal calculi or obstructive changes are seen. The ureters are within normal limits to the urinary bladder. The appendix is not visualize consistent with surgical history. Diverticulosis without diverticulitis is noted. The bladder is well distended. No pelvic mass lesion or sidewall abnormality is noted.  Vascular: The abdominal aorta demonstrates atherosclerotic changes with only mild ectasia in a maximal dimension of 2.7 cm. The celiac axis and superior mesenteric artery demonstrate atherosclerotic disease proximally with mild stenosis identified. The inferior mesenteric artery is occluded at its origin. Single renal is identified identified on the right. A right renal artery stent is seen which appears widely patent. Dual renal arteries are noted on the left. Atherosclerotic changes are noted in the main renal artery on the left.  Approximately 5 cm below the renal arteries there is evidence of occlusion of the distal abdominal aorta. No patent dissection is noted. The occlusion extends into the left common iliac artery to the level of the iliac bifurcation. On the right common it extends to the level of the distal right common iliac  artery. More distal external iliac arteries and common femoral arteries are patent fed via collaterals predominately from the deep circumflex iliac arteries bilaterally. The internal iliac artery on the right is occluded proximally although distally is opacified by pelvic collaterals. The internal iliac artery on the left is within normal limits. Some filling defects are noted within the profunda femoral branches on the right consistent with emboli.  The bony structures are within normal limits.  Review of the MIP images confirms the above findings.  IMPRESSION: Distal aortic occlusion extending into the iliac arteries bilaterally. There also all filling defects within the right profunda femoral arterial branches as well as occlusion at the origin of the right internal iliac artery. These changes may be related to massive distal embolism with subsequent aortic occlusion. By history there are absent pedal pulses which would also suggest some more distal showering of emboli.  Distal thoracic aortic atherosclerotic change.  Patent right renal stent.  These results were called by telephone at the time of interpretation on 08/29/2014 at 7:26 pm to Dr. Hinda Kehr , who verbally acknowledged these results.   Electronically Signed   By: Inez Catalina M.D.   On: 08/29/2014 19:26  Ct Cta Abd/pel W/cm &/or W/o Cm  08/29/2014   CLINICAL DATA:  Lower back pain with weakness and distal numbness, initial encounter  EXAM: CT ANGIOGRAPHY CHEST, ABDOMEN AND PELVIS  TECHNIQUE: Multidetector CT imaging through the chest, abdomen and pelvis was performed using the standard protocol during bolus administration of intravenous contrast. Multiplanar reconstructed images and MIPs were obtained and reviewed to evaluate the vascular anatomy.  CONTRAST:  131mL OMNIPAQUE IOHEXOL 350 MG/ML SOLN  COMPARISON:  None.  FINDINGS: CTA CHEST FINDINGS  Small right-sided pleural effusion is noted. The lungs are well aerated without focal infiltrate.  Biapical scarring is seen.  The thoracic inlet shows evidence of bilateral thyroid nodules. The thoracic aorta and its branches are within normal limits. The left vertebral artery arises directly from the aorta. Diffuse atherosclerotic changes and coronary calcifications are seen without evidence of dissection or aneurysmal dilatation. The degree of atherosclerotic disease is increased in the descending thoracic aorta. Pulmonary artery is within normal limits. No findings to suggest pulmonary emboli are seen.  Review of the MIP images confirms the above findings.  CTA ABDOMEN AND PELVIS FINDINGS  The liver, gallbladder, spleen, adrenal glands and pancreas are within normal limits. The kidneys are well visualized bilaterally. Renal cystic changes noted on the left. No renal calculi or obstructive changes are seen. The ureters are within normal limits to the urinary bladder. The appendix is not visualize consistent with surgical history. Diverticulosis without diverticulitis is noted. The bladder is well distended. No pelvic mass lesion or sidewall abnormality is noted.  Vascular: The abdominal aorta demonstrates atherosclerotic changes with only mild ectasia in a maximal dimension of 2.7 cm. The celiac axis and superior mesenteric artery demonstrate atherosclerotic disease proximally with mild stenosis identified. The inferior mesenteric artery is occluded at its origin. Single renal is identified identified on the right. A right renal artery stent is seen which appears widely patent. Dual renal arteries are noted on the left. Atherosclerotic changes are noted in the main renal artery on the left.  Approximately 5 cm below the renal arteries there is evidence of occlusion of the distal abdominal aorta. No patent dissection is noted. The occlusion extends into the left common iliac artery to the level of the iliac bifurcation. On the right common it extends to the level of the distal right common iliac artery. More  distal external iliac arteries and common femoral arteries are patent fed via collaterals predominately from the deep circumflex iliac arteries bilaterally. The internal iliac artery on the right is occluded proximally although distally is opacified by pelvic collaterals. The internal iliac artery on the left is within normal limits. Some filling defects are noted within the profunda femoral branches on the right consistent with emboli.  The bony structures are within normal limits.  Review of the MIP images confirms the above findings.  IMPRESSION: Distal aortic occlusion extending into the iliac arteries bilaterally. There also all filling defects within the right profunda femoral arterial branches as well as occlusion at the origin of the right internal iliac artery. These changes may be related to massive distal embolism with subsequent aortic occlusion. By history there are absent pedal pulses which would also suggest some more distal showering of emboli.  Distal thoracic aortic atherosclerotic change.  Patent right renal stent.  These results were called by telephone at the time of interpretation on 08/29/2014 at 7:26 pm to Dr. Hinda Kehr , who verbally acknowledged these results.   Electronically Signed   By: Elta Guadeloupe  Lukens M.D.   On: 08/29/2014 19:26   <ECG>   IMPRESSION AND PLAN:   81 year old female with past medical history of chronic afibrillation, glaucoma, hypertension, hyperlipidemia, peripheral vascular disease, GERD came into the hospital due to back pain and lower extremity weakness and noted to have an acute aortic occlusion.  #1 preoperative medical evaluation-patient is likely a moderate risk for vascular surgery. -There is no absolute contraindication to surgery at this time. -EKG reviewed shows atrial fibrillation which is chronic for the patient with no acute ST or T-wave changes. The mild troponin elevation is likely demand ischemia without evidence of acute coronary  syndrome.  #2 atrial fibrillation-this is chronic for the patient. -Related to slightly uncontrolled presently due to her pain. -Continue sotalol. Patient stopped her Coumadin recently due to diarrhea/GI bleed. [facility] consult cardiology patient is followed by Dr. Clayborn Bigness.  #3 hypertension-slightly uncontrolled due to her pain. -Continue Imdur, Irbesertan, hydralazine, Norvasc.  #4 glaucoma-continue dorzolamide/timolol, latanoprost eyedrops.  #5 GERD-continue Protonix.  #6 hyperlipidemia-continue atorvastatin.  #7 history of recent diarrhea-now resolved. Hold Flagyl. Hemoglobin stable without any evidence of acute GI bleed presently.   All the records are reviewed and case discussed with Consulting provider. Management plans discussed with the patient, family and they are in agreement.  CODE STATUS:  Full  TOTAL TIME TAKING CARE OF THIS PATIENT:  50 minutes.    Henreitta Leber M.D on 08/29/2014 at 8:12 PM  Between 7am to 6pm - Pager - [phone]  After 6pm go to [email or web address] - password [email or web address] [facility]  [place] [facility]  Office  [phone]  CC: Primary care Physician: Cletis Athens, MD

## 2014-08-29 NOTE — ED Provider Notes (Signed)
Medical screening examination/treatment/procedure(s) were conducted as a shared visit with non-physician practitioner(s) and myself.  I personally evaluated the patient during the encounter.   6:41 PM Max Sane came to get me as soon as she saw the patient given her concern for possible aortic dissection.  Please refer to her H&P for additional details, but in short, the patient had acute onset of back pain, primarily upper back, which occurred about 6 hours ago and has gotten steadily worse.  She also has acute weakness and numbness in both of her legs and cannot stand up well.  Given the combination of new, nonreproducible back pain, severe hypertension with blood pressures in the 200/120 range, slight tachycardia at around 100-110, and neurovascular deficits in her legs, I am very concerned about aortic dissection.  Her lab work approximately 6 weeks ago indicated a GFR of 33, but I believe she may have an acute life-threatening condition which necessitates immediate CT angiography rather than waiting for new lab results.  I discussed this with the radiologist and [facility] who understands and agrees.  I also discussed this with the CT technicians at [facility].  I also discussed my concerns with the patient who understands the need for emergent imaging.  Ms. Sabra Heck has been unable to find peripheral pulses with a bedside Doppler further illustrating the need for emergent imaging.  Given my concern for aortic dissection, I have ordered esmolol bolus and drip to begin the process of controlling her tachycardia and blood pressure.  She will go quickly to the CT scanner.  ED ECG REPORT I, Dayelin Balducci, the attending physician, personally viewed and interpreted this ECG.   Date: 08/29/2014  EKG Time: 18:26  Rate: 99  Rhythm: atrial fibrillation, rate 99  Axis: Left axis deviation  Intervals:Atrial fibrillation  ST&T Change: Non-specific ST segment / T-wave changes, but no  evidence of acute ischemia.   ----------------------------------------- 7:31 PM on 08/29/2014 -----------------------------------------  Radiologist called me and discussed the findings of complete distal aortic occlusion extending into the iliacs.  He ordered heparin and called pharmacy to expedite the process.  I then Paged Dr. Trula Slade with vascular surgery.  Holding esmolol at this time.  ----------------------------------------- 8:00 PM on 08/29/2014 -----------------------------------------  Dr. Trula Slade return my page and was immediately.  I discussed the case with him and he will come as soon as possible to the hospital to take the patient to the operating room.  I discussed the appropriate blood pressure and heart rate management and whether or not I should continue my plan for an esmolol drip which I initiated when I thought the patient had an aortic dissection.  He recommended holding off at this time.  He agreed with my plan for a heparin bolus and drip.  I updated the patient.  She remains hemodynamically stable.  CRITICAL CARE Performed by: Hinda Kehr   Total critical care time: 45 minutes  Critical care time was exclusive of separately billable procedures and treating other patients.  Critical care was necessary to treat or prevent imminent or life-threatening deterioration.  Critical care was time spent personally by me on the following activities: development of treatment plan with patient and/or surrogate as well as nursing, discussions with consultants, evaluation of patient's response to treatment, examination of patient, obtaining history from patient or surrogate, ordering and performing treatments and interventions, ordering and review of laboratory studies, ordering and review of radiographic studies, pulse oximetry and re-evaluation of patient's condition.    Final diagnoses:  Aortic occlusion     Hinda Kehr, MD 08/30/14 [phone]

## 2014-08-29 NOTE — Anesthesia Preprocedure Evaluation (Addendum)
Anesthesia Evaluation  Patient identified by MRN, date of birth, ID band Patient awake    Reviewed: Allergy & Precautions, NPO status , Patient's Chart, lab work & pertinent test results  History of Anesthesia Complications Negative for: history of anesthetic complications  Airway Mallampati: III  TM Distance: >3 FB Neck ROM: Full    Dental  (+) Partial Lower, Partial Upper   Pulmonary          Cardiovascular hypertension, Pt. on medications + CAD (troponin mildly elevated - probable demand ischemia) and + Peripheral Vascular Disease + dysrhythmias Atrial Fibrillation     Neuro/Psych    GI/Hepatic GERD-  Medicated,  Endo/Other    Renal/GU      Musculoskeletal  (+) Arthritis -, Osteoarthritis,    Abdominal   Peds  Hematology   Anesthesia Other Findings   Reproductive/Obstetrics                            Anesthesia Physical Anesthesia Plan  ASA: III and emergent  Anesthesia Plan: General   Post-op Pain Management:    Induction: Intravenous  Airway Management Planned: Oral ETT  Additional Equipment:   Intra-op Plan:   Post-operative Plan:   Informed Consent: I have reviewed the patients History and Physical, chart, labs and discussed the procedure including the risks, benefits and alternatives for the proposed anesthesia with the patient or authorized representative who has indicated his/her understanding and acceptance.     Plan Discussed with:   Anesthesia Plan Comments:         Anesthesia Quick Evaluation

## 2014-08-29 NOTE — ED Notes (Signed)
Per EMS report, Patient states she had back pain that began after lunch and went to the kitchen approximately two hours ago to get a Tylenol and felt her legs were "too weak to walk and felt numb." Patient states she is compliant with all her meds including blood pressure medications. Patient is alert and oriented, able to move lower extremities and lift hips.

## 2014-08-30 DIAGNOSIS — Z9889 Other specified postprocedural states: Secondary | ICD-10-CM | POA: Diagnosis not present

## 2014-08-30 DIAGNOSIS — I482 Chronic atrial fibrillation: Secondary | ICD-10-CM | POA: Diagnosis not present

## 2014-08-30 DIAGNOSIS — I7410 Embolism and thrombosis of unspecified parts of aorta: Secondary | ICD-10-CM | POA: Diagnosis not present

## 2014-08-30 DIAGNOSIS — D649 Anemia, unspecified: Secondary | ICD-10-CM | POA: Diagnosis not present

## 2014-08-30 DIAGNOSIS — R197 Diarrhea, unspecified: Secondary | ICD-10-CM | POA: Diagnosis present

## 2014-08-30 DIAGNOSIS — K5900 Constipation, unspecified: Secondary | ICD-10-CM | POA: Diagnosis present

## 2014-08-30 DIAGNOSIS — Z801 Family history of malignant neoplasm of trachea, bronchus and lung: Secondary | ICD-10-CM | POA: Diagnosis not present

## 2014-08-30 DIAGNOSIS — E860 Dehydration: Secondary | ICD-10-CM | POA: Diagnosis present

## 2014-08-30 DIAGNOSIS — Z882 Allergy status to sulfonamides status: Secondary | ICD-10-CM | POA: Diagnosis not present

## 2014-08-30 DIAGNOSIS — I743 Embolism and thrombosis of arteries of the lower extremities: Secondary | ICD-10-CM | POA: Diagnosis not present

## 2014-08-30 DIAGNOSIS — E876 Hypokalemia: Secondary | ICD-10-CM | POA: Diagnosis present

## 2014-08-30 DIAGNOSIS — M1990 Unspecified osteoarthritis, unspecified site: Secondary | ICD-10-CM | POA: Diagnosis present

## 2014-08-30 DIAGNOSIS — Z9851 Tubal ligation status: Secondary | ICD-10-CM | POA: Diagnosis not present

## 2014-08-30 DIAGNOSIS — E785 Hyperlipidemia, unspecified: Secondary | ICD-10-CM | POA: Diagnosis not present

## 2014-08-30 DIAGNOSIS — Z880 Allergy status to penicillin: Secondary | ICD-10-CM | POA: Diagnosis not present

## 2014-08-30 DIAGNOSIS — K219 Gastro-esophageal reflux disease without esophagitis: Secondary | ICD-10-CM | POA: Diagnosis present

## 2014-08-30 DIAGNOSIS — E780 Pure hypercholesterolemia: Secondary | ICD-10-CM | POA: Diagnosis present

## 2014-08-30 DIAGNOSIS — R001 Bradycardia, unspecified: Secondary | ICD-10-CM | POA: Diagnosis present

## 2014-08-30 DIAGNOSIS — M549 Dorsalgia, unspecified: Secondary | ICD-10-CM | POA: Diagnosis present

## 2014-08-30 DIAGNOSIS — Z888 Allergy status to other drugs, medicaments and biological substances status: Secondary | ICD-10-CM | POA: Diagnosis not present

## 2014-08-30 DIAGNOSIS — I7419 Embolism and thrombosis of other parts of aorta: Secondary | ICD-10-CM | POA: Diagnosis present

## 2014-08-30 DIAGNOSIS — I10 Essential (primary) hypertension: Secondary | ICD-10-CM | POA: Diagnosis not present

## 2014-08-30 DIAGNOSIS — N289 Disorder of kidney and ureter, unspecified: Secondary | ICD-10-CM

## 2014-08-30 DIAGNOSIS — H409 Unspecified glaucoma: Secondary | ICD-10-CM | POA: Diagnosis present

## 2014-08-30 DIAGNOSIS — I739 Peripheral vascular disease, unspecified: Secondary | ICD-10-CM | POA: Diagnosis present

## 2014-08-30 DIAGNOSIS — R109 Unspecified abdominal pain: Secondary | ICD-10-CM | POA: Diagnosis present

## 2014-08-30 DIAGNOSIS — D62 Acute posthemorrhagic anemia: Secondary | ICD-10-CM | POA: Diagnosis present

## 2014-08-30 DIAGNOSIS — Z9049 Acquired absence of other specified parts of digestive tract: Secondary | ICD-10-CM | POA: Diagnosis present

## 2014-08-30 DIAGNOSIS — I700 Atherosclerosis of aorta: Secondary | ICD-10-CM

## 2014-08-30 DIAGNOSIS — I7092 Chronic total occlusion of artery of the extremities: Secondary | ICD-10-CM | POA: Diagnosis not present

## 2014-08-30 DIAGNOSIS — Z90710 Acquired absence of both cervix and uterus: Secondary | ICD-10-CM | POA: Diagnosis not present

## 2014-08-30 DIAGNOSIS — D696 Thrombocytopenia, unspecified: Secondary | ICD-10-CM | POA: Diagnosis present

## 2014-08-30 DIAGNOSIS — I248 Other forms of acute ischemic heart disease: Secondary | ICD-10-CM | POA: Diagnosis present

## 2014-08-30 LAB — CBC WITH DIFFERENTIAL/PLATELET
BASOS ABS: 0 10*3/uL (ref 0–0.1)
BASOS PCT: 0 %
Eosinophils Absolute: 0 10*3/uL (ref 0–0.7)
Eosinophils Relative: 0 %
HCT: 32.4 % — ABNORMAL LOW (ref 35.0–47.0)
Hemoglobin: 10.5 g/dL — ABNORMAL LOW (ref 12.0–16.0)
Lymphocytes Relative: 6 %
Lymphs Abs: 0.6 10*3/uL — ABNORMAL LOW (ref 1.0–3.6)
MCH: 27.8 pg (ref 26.0–34.0)
MCHC: 32.6 g/dL (ref 32.0–36.0)
MCV: 85.3 fL (ref 80.0–100.0)
Monocytes Absolute: 0.7 10*3/uL (ref 0.2–0.9)
Monocytes Relative: 7 %
Neutro Abs: 8.7 10*3/uL — ABNORMAL HIGH (ref 1.4–6.5)
Neutrophils Relative %: 87 %
Platelets: 129 10*3/uL — ABNORMAL LOW (ref 150–440)
RBC: 3.79 MIL/uL — ABNORMAL LOW (ref 3.80–5.20)
RDW: 16.2 % — ABNORMAL HIGH (ref 11.5–14.5)
WBC: 10 10*3/uL (ref 3.6–11.0)

## 2014-08-30 LAB — COMPREHENSIVE METABOLIC PANEL
ALT: 25 U/L (ref 14–54)
ANION GAP: 7 (ref 5–15)
AST: 37 U/L (ref 15–41)
Albumin: 3.3 g/dL — ABNORMAL LOW (ref 3.5–5.0)
Alkaline Phosphatase: 72 U/L (ref 38–126)
BUN: 25 mg/dL — ABNORMAL HIGH (ref 6–20)
CALCIUM: 8.3 mg/dL — AB (ref 8.9–10.3)
CO2: 28 mmol/L (ref 22–32)
CREATININE: 1.18 mg/dL — AB (ref 0.44–1.00)
Chloride: 106 mmol/L (ref 101–111)
GFR calc Af Amer: 46 mL/min — ABNORMAL LOW (ref >60)
GFR calc non Af Amer: 39 mL/min — ABNORMAL LOW (ref >60)
Glucose, Bld: 134 mg/dL — ABNORMAL HIGH (ref 65–99)
POTASSIUM: 3.7 mmol/L (ref 3.5–5.1)
Sodium: 141 mmol/L (ref 135–145)
Total Bilirubin: 0.8 mg/dL (ref 0.3–1.2)
Total Protein: 5.6 g/dL — ABNORMAL LOW (ref 6.5–8.1)

## 2014-08-30 LAB — BASIC METABOLIC PANEL
Anion gap: 9 (ref 5–15)
BUN: 29 mg/dL — ABNORMAL HIGH (ref 6–20)
CALCIUM: 8.4 mg/dL — AB (ref 8.9–10.3)
CHLORIDE: 106 mmol/L (ref 101–111)
CO2: 24 mmol/L (ref 22–32)
Creatinine, Ser: 1.36 mg/dL — ABNORMAL HIGH (ref 0.44–1.00)
GFR calc non Af Amer: 33 mL/min — ABNORMAL LOW (ref >60)
GFR, EST AFRICAN AMERICAN: 38 mL/min — AB (ref >60)
Glucose, Bld: 133 mg/dL — ABNORMAL HIGH (ref 65–99)
Potassium: 4 mmol/L (ref 3.5–5.1)
SODIUM: 139 mmol/L (ref 135–145)

## 2014-08-30 LAB — HEPARIN LEVEL (UNFRACTIONATED)
HEPARIN UNFRACTIONATED: 0.42 [IU]/mL (ref 0.30–0.70)
Heparin Unfractionated: 0.11 IU/mL — ABNORMAL LOW (ref 0.30–0.70)

## 2014-08-30 LAB — CBC
HCT: 28.6 % — ABNORMAL LOW (ref 35.0–47.0)
HEMOGLOBIN: 9.3 g/dL — AB (ref 12.0–16.0)
MCH: 28 pg (ref 26.0–34.0)
MCHC: 32.5 g/dL (ref 32.0–36.0)
MCV: 86 fL (ref 80.0–100.0)
Platelets: 131 10*3/uL — ABNORMAL LOW (ref 150–440)
RBC: 3.32 MIL/uL — ABNORMAL LOW (ref 3.80–5.20)
RDW: 16.7 % — ABNORMAL HIGH (ref 11.5–14.5)
WBC: 12.4 10*3/uL — ABNORMAL HIGH (ref 3.6–11.0)

## 2014-08-30 LAB — APTT: APTT: 68 s — AB (ref 24–36)

## 2014-08-30 LAB — MRSA PCR SCREENING: MRSA by PCR: NEGATIVE

## 2014-08-30 MED ORDER — DOCUSATE SODIUM 100 MG PO CAPS
100.0000 mg | ORAL_CAPSULE | Freq: Every day | ORAL | Status: DC
  Administered 2014-08-31 – 2014-09-03 (×3): 100 mg via ORAL
  Filled 2014-08-30 (×5): qty 1

## 2014-08-30 MED ORDER — POLYETHYLENE GLYCOL 3350 17 G PO PACK
17.0000 g | PACK | Freq: Every day | ORAL | Status: DC
  Administered 2014-08-30 – 2014-09-03 (×4): 17 g via ORAL
  Filled 2014-08-30 (×5): qty 1

## 2014-08-30 MED ORDER — LABETALOL HCL 5 MG/ML IV SOLN
INTRAVENOUS | Status: DC | PRN
  Administered 2014-08-30: 5 mg via INTRAVENOUS

## 2014-08-30 MED ORDER — ONDANSETRON HCL 4 MG/2ML IJ SOLN
INTRAMUSCULAR | Status: DC | PRN
  Administered 2014-08-30: 4 mg via INTRAVENOUS

## 2014-08-30 MED ORDER — FENTANYL CITRATE (PF) 100 MCG/2ML IJ SOLN: 25.0000 ug | INTRAMUSCULAR | Status: DC | PRN

## 2014-08-30 MED ORDER — PANTOPRAZOLE SODIUM 40 MG PO TBEC: 40.0000 mg | DELAYED_RELEASE_TABLET | Freq: Every day | ORAL | Status: DC

## 2014-08-30 MED ORDER — LABETALOL HCL 5 MG/ML IV SOLN: 10.0000 mg | INTRAVENOUS | Status: DC | PRN

## 2014-08-30 MED ORDER — MAGNESIUM SULFATE 2 GM/50ML IV SOLN
2.0000 g | Freq: Every day | INTRAVENOUS | Status: DC | PRN
  Filled 2014-08-30: qty 50

## 2014-08-30 MED ORDER — METOPROLOL TARTRATE 1 MG/ML IV SOLN: 2.0000 mg | INTRAVENOUS | Status: DC | PRN

## 2014-08-30 MED ORDER — SENNOSIDES-DOCUSATE SODIUM 8.6-50 MG PO TABS: 1.0000 | ORAL_TABLET | Freq: Every evening | ORAL | Status: DC | PRN

## 2014-08-30 MED ORDER — DORZOLAMIDE HCL-TIMOLOL MAL 2-0.5 % OP SOLN
1.0000 [drp] | Freq: Three times a day (TID) | OPHTHALMIC | Status: DC
  Administered 2014-08-30 – 2014-09-06 (×21): 1 [drp] via OPHTHALMIC
  Filled 2014-08-30: qty 10

## 2014-08-30 MED ORDER — OXYCODONE HCL 5 MG PO TABS
5.0000 mg | ORAL_TABLET | ORAL | Status: DC | PRN
  Administered 2014-08-30 – 2014-09-02 (×2): 5 mg via ORAL
  Filled 2014-08-30 (×2): qty 1

## 2014-08-30 MED ORDER — ACETAMINOPHEN 650 MG RE SUPP
325.0000 mg | RECTAL | Status: DC | PRN
Start: 2014-08-30 — End: 2014-09-06

## 2014-08-30 MED ORDER — LATANOPROST 0.005 % OP SOLN
1.0000 [drp] | Freq: Every day | OPHTHALMIC | Status: DC
  Administered 2014-08-30 – 2014-09-05 (×7): 1 [drp] via OPHTHALMIC
  Filled 2014-08-30: qty 2.5

## 2014-08-30 MED ORDER — SODIUM CHLORIDE 0.9 % IV SOLN: 500.0000 mL | Freq: Once | INTRAVENOUS | Status: DC | PRN

## 2014-08-30 MED ORDER — ACETAMINOPHEN 325 MG PO TABS: 325.0000 mg | ORAL_TABLET | ORAL | Status: DC | PRN

## 2014-08-30 MED ORDER — POTASSIUM CHLORIDE 20 MEQ PO PACK
20.0000 meq | PACK | Freq: Once | ORAL | Status: AC
  Administered 2014-08-30: 20 meq via ORAL
  Filled 2014-08-30: qty 1

## 2014-08-30 MED ORDER — HYDRALAZINE HCL 25 MG PO TABS
25.0000 mg | ORAL_TABLET | Freq: Every day | ORAL | Status: DC
  Administered 2014-08-31 – 2014-09-06 (×6): 25 mg via ORAL
  Filled 2014-08-30 (×6): qty 1

## 2014-08-30 MED ORDER — HYDRALAZINE HCL 20 MG/ML IJ SOLN
INTRAMUSCULAR | Status: AC
  Administered 2014-08-30: 15 mg via INTRAVENOUS
  Filled 2014-08-30: qty 1

## 2014-08-30 MED ORDER — HYDRALAZINE HCL 20 MG/ML IJ SOLN: 5.0000 mg | INTRAMUSCULAR | Status: DC | PRN

## 2014-08-30 MED ORDER — ONDANSETRON HCL 4 MG/2ML IJ SOLN: 4.0000 mg | Freq: Once | INTRAMUSCULAR | Status: DC | PRN

## 2014-08-30 MED ORDER — IRBESARTAN 75 MG PO TABS
37.5000 mg | ORAL_TABLET | Freq: Every day | ORAL | Status: DC
  Administered 2014-08-31 – 2014-09-06 (×7): 37.5 mg via ORAL
  Filled 2014-08-30 (×3): qty 1
  Filled 2014-08-30: qty 2
  Filled 2014-08-30: qty 1
  Filled 2014-08-30: qty 0.5
  Filled 2014-08-30: qty 1
  Filled 2014-08-30: qty 2

## 2014-08-30 MED ORDER — HYDRALAZINE HCL 20 MG/ML IJ SOLN
INTRAMUSCULAR | Status: AC
  Filled 2014-08-30: qty 1

## 2014-08-30 MED ORDER — SOTALOL HCL 80 MG PO TABS: 120.0000 mg | ORAL_TABLET | Freq: Two times a day (BID) | ORAL | Status: DC

## 2014-08-30 MED ORDER — MORPHINE SULFATE 2 MG/ML IJ SOLN
2.0000 mg | INTRAMUSCULAR | Status: DC | PRN
  Administered 2014-09-02: 2 mg via INTRAVENOUS
  Filled 2014-08-30: qty 1

## 2014-08-30 MED ORDER — PHENOL 1.4 % MT LIQD
1.0000 | OROMUCOSAL | Status: DC | PRN
  Filled 2014-08-30: qty 177

## 2014-08-30 MED ORDER — ISOSORBIDE DINITRATE 10 MG PO TABS
30.0000 mg | ORAL_TABLET | Freq: Three times a day (TID) | ORAL | Status: DC
  Administered 2014-08-30 – 2014-09-06 (×21): 30 mg via ORAL
  Filled 2014-08-30 (×21): qty 3

## 2014-08-30 MED ORDER — SODIUM CHLORIDE 0.9 % IV SOLN
INTRAVENOUS | Status: DC
  Administered 2014-08-30: 06:00:00 via INTRAVENOUS

## 2014-08-30 MED ORDER — HEPARIN (PORCINE) IN NACL 100-0.45 UNIT/ML-% IJ SOLN
950.0000 [IU]/h | INTRAMUSCULAR | Status: DC
  Administered 2014-08-31 – 2014-09-02 (×3): 700 [IU]/h via INTRAVENOUS
  Administered 2014-09-03 – 2014-09-04 (×2): 950 [IU]/h via INTRAVENOUS
  Filled 2014-08-30 (×12): qty 250

## 2014-08-30 MED ORDER — ONDANSETRON HCL 4 MG/2ML IJ SOLN
4.0000 mg | Freq: Four times a day (QID) | INTRAMUSCULAR | Status: DC | PRN
  Administered 2014-09-02 – 2014-09-03 (×2): 4 mg via INTRAVENOUS
  Filled 2014-08-30 (×2): qty 2

## 2014-08-30 MED ORDER — GUAIFENESIN-DM 100-10 MG/5ML PO SYRP: 15.0000 mL | ORAL_SOLUTION | ORAL | Status: DC | PRN

## 2014-08-30 MED ORDER — SOTALOL HCL 80 MG PO TABS
80.0000 mg | ORAL_TABLET | Freq: Two times a day (BID) | ORAL | Status: DC
  Administered 2014-08-31 – 2014-09-03 (×7): 80 mg via ORAL
  Filled 2014-08-30 (×7): qty 1

## 2014-08-30 MED ORDER — HEPARIN BOLUS VIA INFUSION
2100.0000 [IU] | Freq: Once | INTRAVENOUS | Status: DC
  Filled 2014-08-30: qty 2100

## 2014-08-30 MED ORDER — SUGAMMADEX SODIUM 200 MG/2ML IV SOLN
INTRAVENOUS | Status: DC | PRN
  Administered 2014-08-30: 150 mg via INTRAVENOUS

## 2014-08-30 MED ORDER — ALUM & MAG HYDROXIDE-SIMETH 200-200-20 MG/5ML PO SUSP
15.0000 mL | ORAL | Status: DC | PRN
  Administered 2014-09-03: 30 mL via ORAL
  Filled 2014-08-30: qty 30

## 2014-08-30 MED ORDER — DARIFENACIN HYDROBROMIDE ER 7.5 MG PO TB24
7.5000 mg | ORAL_TABLET | Freq: Every day | ORAL | Status: DC
  Administered 2014-08-30 – 2014-09-06 (×8): 7.5 mg via ORAL
  Filled 2014-08-30 (×8): qty 1

## 2014-08-30 MED ORDER — ATORVASTATIN CALCIUM 20 MG PO TABS
20.0000 mg | ORAL_TABLET | Freq: Every day | ORAL | Status: DC
  Administered 2014-08-30 – 2014-09-06 (×8): 20 mg via ORAL
  Filled 2014-08-30 (×8): qty 1

## 2014-08-30 MED ORDER — AMLODIPINE BESYLATE 5 MG PO TABS
5.0000 mg | ORAL_TABLET | Freq: Every day | ORAL | Status: DC
  Administered 2014-08-31 – 2014-09-06 (×7): 5 mg via ORAL
  Filled 2014-08-30 (×7): qty 1

## 2014-08-30 MED ORDER — HYDRALAZINE HCL 20 MG/ML IJ SOLN
15.0000 mg | Freq: Once | INTRAMUSCULAR | Status: AC
Start: 2014-08-30 — End: 2014-08-30
  Administered 2014-08-30: 15 mg via INTRAVENOUS

## 2014-08-30 MED ORDER — PANTOPRAZOLE SODIUM 40 MG PO TBEC
40.0000 mg | DELAYED_RELEASE_TABLET | Freq: Every day | ORAL | Status: DC
  Administered 2014-08-30 – 2014-09-06 (×8): 40 mg via ORAL
  Filled 2014-08-30 (×8): qty 1

## 2014-08-30 MED ORDER — POTASSIUM CHLORIDE CRYS ER 20 MEQ PO TBCR: 20.0000 meq | EXTENDED_RELEASE_TABLET | Freq: Every day | ORAL | Status: DC | PRN

## 2014-08-30 NOTE — Anesthesia Postprocedure Evaluation (Signed)
  Anesthesia Post-op Note  Patient: Lori Roberts  Procedure(s) Performed: Procedure(s): bilateral aorta iliac thrombectomy, thromboembolectomy, bilateral femoral thromboembolectomy (Bilateral)  Anesthesia type:General  Patient location: PACU  Post pain: Pain level controlled  Post assessment: Post-op Vital signs reviewed, Patient's Cardiovascular Status Stable, Respiratory Function Stable, Patent Airway and No signs of Nausea or vomiting  Post vital signs: Reviewed and stable  Last Vitals:  Filed Vitals:   08/30/14 1400  BP: 152/57  Pulse: 61  Temp:   Resp: 16    Level of consciousness: awake, alert  and patient cooperative  Complications: No apparent anesthesia complications

## 2014-08-30 NOTE — Progress Notes (Signed)
Patient ID: Lori Roberts, female   DOB: 1921/10/18, 81 y.o.   MRN: [id number] [facility] PROGRESS NOTE  PCP: Cletis Athens, MD  HPI/Subjective:   Objective: Filed Vitals:   08/30/14 1300  BP: 134/76  Pulse:   Temp:   Resp: 13    Intake/Output Summary (Last 24 hours) at 08/30/14 1325 Last data filed at 08/30/14 0400  Gross per 24 hour  Intake 1964.37 ml  Output   2150 ml  Net -185.63 ml   Filed Weights   08/29/14 1702 08/30/14 0200  Weight: 67.132 kg (148 lb) 69 kg (152 lb 1.9 oz)    ROS: Review of Systems  Constitutional: Negative for fever and chills.  Eyes: Negative for blurred vision.  Respiratory: Negative for cough and shortness of breath.   Cardiovascular: Negative for chest pain.  Gastrointestinal: Negative for nausea, vomiting, abdominal pain, diarrhea and constipation.  Genitourinary: Negative for dysuria.  Musculoskeletal: Negative for joint pain.  Neurological: Negative for dizziness and headaches.   Exam: Physical Exam  HENT:  Nose: No mucosal edema.  Mouth/Throat: No oropharyngeal exudate or posterior oropharyngeal edema.  Eyes: Conjunctivae, EOM and lids are normal. Pupils are equal, round, and reactive to light.  Neck: No JVD present. Carotid bruit is not present. No edema present. No thyroid mass and no thyromegaly present.  Cardiovascular: S1 normal and S2 normal.  Exam reveals no gallop.   Murmur heard.  Systolic murmur is present with a grade of 3/6  Pulses:      Dorsalis pedis pulses are 2+ on the right side, and 2+ on the left side.  Respiratory: No respiratory distress. She has no wheezes. She has no rhonchi. She has no rales.  GI: Soft. Bowel sounds are normal. There is no tenderness.  Musculoskeletal:       Right ankle: She exhibits no swelling.       Left ankle: She exhibits no swelling.  Lymphadenopathy:    She has no cervical adenopathy.  Neurological: She is alert. No cranial nerve deficit.  Skin: Skin is warm. No  rash noted. Nails show no clubbing.  Bilateral groin incision site clean and dry.  Psychiatric: She has a normal mood and affect.   Data Reviewed: Basic Metabolic Panel:  Recent Labs Lab 08/29/14 1843 08/30/14 0915  NA 138 141  K 3.4* 3.7  CL 102 106  CO2 25 28  GLUCOSE 114* 134*  BUN 30* 25*  CREATININE 1.37* 1.18*  CALCIUM 9.1 8.3*  MG 1.8  --    Liver Function Tests:  Recent Labs Lab 08/29/14 1843 08/30/14 0915  AST 31 37  ALT 26 25  ALKPHOS 79 72  BILITOT 0.8 0.8  PROT 5.9* 5.6*  ALBUMIN 3.5 3.3*    CBC:  Recent Labs Lab 08/29/14 1843 08/30/14 0915  WBC 6.6 10.0  NEUTROABS 5.2 8.7*  HGB 10.8* 10.5*  HCT 33.3* 32.4*  MCV 85.2 85.3  PLT 132* 129*   Cardiac Enzymes:  Recent Labs Lab 08/29/14 1843  TROPONINI 0.11*   Scheduled Meds: . amLODipine  5 mg Oral Daily  . atorvastatin  20 mg Oral Daily  . darifenacin  7.5 mg Oral Daily  . [START ON 08/31/2014] docusate sodium  100 mg Oral Daily  . dorzolamide-timolol  1 drop Both Eyes TID  . hydrALAZINE  25 mg Oral Daily  . irbesartan  37.5 mg Oral Daily  . isosorbide dinitrate  30 mg Oral TID  . latanoprost  1 drop Both Eyes QHS  .  pantoprazole  40 mg Oral Daily  . sotalol  80 mg Oral BID   Continuous Infusions: . sodium chloride 50 mL/hr at 08/30/14 1300  . heparin 600 Units/hr (08/30/14 1300)    Assessment/Plan:  1. Chronic atrial fibrillation, relative bradycardia- decreased sotalol dose to 80 mg twice a day. Currently on heparin drip and will likely need to go back on Coumadin. 2. Recent GI bleed- continue to monitor closely. Patient is on Protonix empirically. 3. Aortic occlusion- status post bilateral procedures by vascular surgery. Currently on heparin drip. Further management as per vascular surgery. I will leave it up to vascular surgery on whether they feel the patient can be transferred to the floor and out of the ICU. 4. Essential hypertension blood pressure currently stable on usual  medications. No need for IV medication. 5. Hyperlipidemia unspecified on atorvastatin. 6. Glaucoma on specified continue latanoprost. 7. Borderline elevated troponin likely demand ischemia. 8. Hypokalemia replace potassium orally.  Code Status:     Code Status Orders        Start     Ordered   08/30/14 0217  Full code   Continuous     08/30/14 0216    Advance Directive Documentation        Most Recent Value   Type of Advance Directive  Living will   Pre-existing out of facility DNR order (yellow form or pink MOST form)     "MOST" Form in Place?       Disposition Plan: Potentially home.  Time spent: 25 minutes.  Loletha Grayer  [facility]

## 2014-08-30 NOTE — Progress Notes (Signed)
    Subjective  - POD #1, s/p repair of acute aortic occlusion  pain controlled Has ambulated with PT Tolerating clears No stool   Physical Exam:  Bilateral DP/PT doppler signals compartments very soft inclsions clean and soft Abdomen non tender Respirations non labored     Assessment/Plan:  POD #1  CV:  Increase heparin to full dose per pharmacy without bolus.  AFIB rate controlled.  Appreciate cardiology assistance.  Will need d/c on anticoagulation. HTN:  Home meds on hold given appropriate BP Acute blood loss anemia:  Stable HCT F/E/N:  Decrease IVF,  Check labs in am Transfer to floor GI:  Recent GI bleed, continue to monitor given need for anticoagulation   Lori Roberts, [place] 08/30/2014 2:01 PM --  Filed Vitals:   08/30/14 1300  BP: 134/76  Pulse:   Temp:   Resp: 13    Intake/Output Summary (Last 24 hours) at 08/30/14 1401 Last data filed at 08/30/14 0400  Gross per 24 hour  Intake 1964.37 ml  Output   2150 ml  Net -185.63 ml     Laboratory CBC    Component Value Date/Time   WBC 10.0 08/30/2014 0915   WBC 7.0 01/10/2014 1528   HGB 10.5* 08/30/2014 0915   HGB 13.0 01/10/2014 1528   HCT 32.4* 08/30/2014 0915   HCT 39.5 01/10/2014 1528   PLT 129* 08/30/2014 0915   PLT 147* 01/10/2014 1528    BMET    Component Value Date/Time   NA 141 08/30/2014 0915   NA 142 01/10/2014 1528   K 3.7 08/30/2014 0915   K 3.8 01/10/2014 1528   CL 106 08/30/2014 0915   CL 106 01/10/2014 1528   CO2 28 08/30/2014 0915   CO2 31 01/10/2014 1528   GLUCOSE 134* 08/30/2014 0915   GLUCOSE 144* 01/10/2014 1528   BUN 25* 08/30/2014 0915   BUN 37* 01/10/2014 1528   CREATININE 1.18* 08/30/2014 0915   CREATININE 1.87* 01/10/2014 1528   CALCIUM 8.3* 08/30/2014 0915   CALCIUM 9.0 01/10/2014 1528   GFRNONAA 39* 08/30/2014 0915   GFRAA 46* 08/30/2014 0915    COAG Lab Results  Component Value Date   INR 1.11 08/29/2014   INR 1.40 08/17/2014   INR 1.9 12/30/2013     No results found for: PTT  Antibiotics Anti-infectives    None       V. Leia Alf, M.D. [facility] Office: [phone] Pager:  [phone]

## 2014-08-30 NOTE — Progress Notes (Signed)
ANTICOAGULATION CONSULT NOTE - Initial Consult  Pharmacy Consult for Heparin Indication: atrial fibrillation   Patient Measurements: Height: 5\' 5"  (165.1 cm) Weight: 152 lb 1.9 oz (69 kg) IBW/kg (Calculated) : 57 Heparin Dosing Weight: 69kg  Vital Signs: Temp: 98.2 F (36.8 C) (06/11 0700) Temp Source: Oral (06/11 0700) BP: 134/76 mmHg (06/11 1300) Pulse Rate: 73 (06/11 1000)  Labs:  Recent Labs  08/29/14 1843 08/30/14 0915 08/30/14 1450  HGB 10.8* 10.5*  --   HCT 33.3* 32.4*  --   PLT 132* 129*  --   APTT 27  --  68*  LABPROT 14.5  --   --   INR 1.11  --   --   HEPARINUNFRC  --   --  0.42  CREATININE 1.37* 1.18*  --   TROPONINI 0.11*  --   --     Estimated Creatinine Clearance: 30.9 mL/min (by C-G formula based on Cr of 1.18).  Medications:  Infusions:  . sodium chloride 10 mL/hr at 08/30/14 1432  . heparin 600 Units/hr (08/30/14 1300)    Assessment: Patient previously on heparin per vascular surgery after repair of acute aortic occlusion. Started on heparin 600 units/hr at 0300 this morning. Now transitioning to heparin per pharmacy. Dosing for afib, MD instructions NOT to bolus patient.   Heparin therapeutic  Goal of Therapy:  Heparin level 0.3-0.7 units/ml Monitor platelets by anticoagulation protocol: Yes   Plan:  Heparin therapeutic, will continue current rate and recheck in 8 hours. If HL is low, will increase rate without bolus per MD instructions.   Pharmacy to follow per consult  Rexene Edison, PharmD Clinical Pharmacist 08/30/2014 3:30 PM

## 2014-08-30 NOTE — Progress Notes (Signed)
Sinus rhythm, sinus ahrrythmia per telemetry clerk

## 2014-08-30 NOTE — Progress Notes (Signed)
Calf is soft, no swelling noted

## 2014-08-30 NOTE — Progress Notes (Addendum)
ANTICOAGULATION CONSULT NOTE - Follow Up Consult  Pharmacy Consult for Heparin Indication: atrial fibrillation  Allergies  Allergen Reactions  . Wool Alcohol [Lanolin] Swelling  . Azor [Amlodipine-Olmesartan] Rash  . Clindamycin/Lincomycin Rash  . Iodine Rash  . Mebaral [Mephobarbital] Rash  . Penicillins Rash  . Sulfa Antibiotics Rash    Patient Measurements: Height: 5\' 5"  (165.1 cm) Weight: 152 lb 1.9 oz (69 kg) IBW/kg (Calculated) : 57 Heparin Dosing Weight: 69 kg  Vital Signs: Temp: 100.8 F (38.2 C) (06/11 2052) Temp Source: Oral (06/11 2052) BP: 148/66 mmHg (06/11 2052) Pulse Rate: 77 (06/11 2052)  Labs:  Recent Labs  08/29/14 1843 08/30/14 0915 08/30/14 1450 08/30/14 2246  HGB 10.8* 10.5*  --  9.3*  HCT 33.3* 32.4*  --  28.6*  PLT 132* 129*  --  131*  APTT 27  --  68*  --   LABPROT 14.5  --   --   --   INR 1.11  --   --   --   HEPARINUNFRC  --   --  0.42 0.11*  CREATININE 1.37* 1.18*  --  1.36*  TROPONINI 0.11*  --   --   --     Estimated Creatinine Clearance: 26.8 mL/min (by C-G formula based on Cr of 1.36).   Medications:  Scheduled:  . amLODipine  5 mg Oral Daily  . atorvastatin  20 mg Oral Daily  . darifenacin  7.5 mg Oral Daily  . [START ON 08/31/2014] docusate sodium  100 mg Oral Daily  . dorzolamide-timolol  1 drop Both Eyes TID  . heparin  2,100 Units Intravenous Once  . hydrALAZINE  25 mg Oral Daily  . irbesartan  37.5 mg Oral Daily  . isosorbide dinitrate  30 mg Oral TID  . latanoprost  1 drop Both Eyes QHS  . pantoprazole  40 mg Oral Daily  . polyethylene glycol  17 g Oral Daily  . sotalol  80 mg Oral BID   Infusions:  . sodium chloride 10 mL/hr at 08/30/14 1656  . heparin 600 Units/hr (08/30/14 1656)   PRN: acetaminophen **OR** acetaminophen, alum & mag hydroxide-simeth, guaiFENesin-dextromethorphan, magnesium sulfate 1 - 4 g bolus IVPB, morphine injection, ondansetron, oxyCODONE, phenol, potassium chloride,  senna-docusate  Assessment: Patient previously on heparin per vascular surgery after repair of acute aortic occlusion. Dosing for afib, MD instructions NOT to bolus patient. Heparin level is below goal but drip has been turned off for a few hours per RN.   Goal of Therapy:  Heparin level 0.3-0.7 units/ml Monitor platelets by anticoagulation protocol: Yes   Plan:  Will continue heparin infusion at 600 units/hr and recheck HL in 8 h.   Ulice Dash D 08/30/2014,11:32 PM

## 2014-08-30 NOTE — Op Note (Signed)
    Patient name: Lori Roberts MRN: [id number] DOB: Oct 16, 1921 Sex: female  08/29/2014 Pre-operative Diagnosis: acute aortic occlusion Post-operative diagnosis:  Same Surgeon:  Annamarie Major Procedure:   #1: Bilateral femoral artery exposure   #2: Aorto bi iliac thromboembolectomy   #3: Bilateral femoral-popliteal thromboembolectomy Anesthesia:  Gen. Blood Loss:  See anesthesia record Specimens:  Aortic thrombus and right profunda femoral thrombus  Findings:  Acute thrombus was encountered in both groins.  A large amount of clot was evacuated from the aorta.  I was unable to pass a Fogarty catheter down the right leg and One getting hung up at 30 cm.  On the left, the Fogarty went all the way down.  The patient had good Doppler signals in the anterior tibial posterior tibial artery after the procedure.  Indications:  The patient presented to the emergency department with a acute onset of back pain as well as leg weakness.  CT angiogram revealed acute aortic occlusion.  Clinically, the patient had minimal sensation in either lower extremity and decreased movement, right greater than left.  Procedure:  The patient was identified in the holding area and taken to [place] [address]  The patient was then placed supine on the table. general anesthesia was administered.  The patient was prepped and draped in the usual sterile fashion.  A time out was called and antibiotics were administered.  Bilateral longitudinal groin incisions were made.  The common femoral, profunda femoral and superficial femoral arteries were individually isolated and encircled with Vesseloops.  The patient had been on a continuous heparin infusion.  I stopped the infusion and re-bolused the patient.  The femoral arteries were moderately calcified.  The left was worse than the right.  I then occluded the vessels bilaterally.  I made a transverse arteriotomy in the right groin at the level of the profunda origin this was extended  transversely with Potts scissors.  There was fresh clot within the femoral artery.  I used multiple passes of a #4 and #6 Fogarty catheter and evacuated a large amount of thrombus which was sent for specimen.  I then passed the catheter down the profunda.  No clot was evacuated.  A plastic Fogarty catheter down the superficial femoral artery but could not get it to go past 30 cm after multiple attempts.  I then opened the left common femoral artery transversely at the level of the profunda origin.  I again passed the #4 and 6 Fogarty catheters into the aorta and evacuated additional clot within the aortoiliac section.  Once all of the clot was evacuated there was excellent inflow.  I then passed the catheter down the profunda femoral as well as the superficial femoral artery.  I was able to get a #4 Fogarty catheter to go all the way down.  No clot was evacuated.  I then closed both femoral arteriotomies with running 5-0 Prolene in transverse fashion.  Blood flow was then reestablished to each leg.  The patient had adequate Doppler signals in the dorsalis pedis and posterior tibial artery.  I did not reverse the heparin.  Surgicel was placed in the groin wounds.  The incisions were closed with multiple layers of 2-0 and 3-0 Vicryls followed by Dermabond on the skin.   Disposition:  To PACU in stable condition.   Theotis Burrow, M.D. [facility] Office: [phone] Pager:  [phone]

## 2014-08-30 NOTE — Plan of Care (Signed)
Problem: Diagnosis - Type of Surgery Goal: Remains infection free Outcome: Progressing started

## 2014-08-30 NOTE — Progress Notes (Signed)
ANTICOAGULATION CONSULT NOTE - Initial Consult  Pharmacy Consult for Heparin Indication: atrial fibrillation   Patient Measurements: Height: 5\' 5"  (165.1 cm) Weight: 152 lb 1.9 oz (69 kg) IBW/kg (Calculated) : 57 Heparin Dosing Weight: 69kg  Vital Signs: Temp: 98.2 F (36.8 C) (06/11 0700) Temp Source: Oral (06/11 0700) BP: 134/76 mmHg (06/11 1300) Pulse Rate: 73 (06/11 1000)  Labs:  Recent Labs  08/29/14 1843 08/30/14 0915  HGB 10.8* 10.5*  HCT 33.3* 32.4*  PLT 132* 129*  APTT 27  --   LABPROT 14.5  --   INR 1.11  --   CREATININE 1.37* 1.18*  TROPONINI 0.11*  --     Estimated Creatinine Clearance: 30.9 mL/min (by C-G formula based on Cr of 1.18).  Medications:  Infusions:  . sodium chloride 10 mL/hr at 08/30/14 1432  . heparin 600 Units/hr (08/30/14 1300)    Assessment: Patient previously on heparin per vascular surgery after repair of acute aortic occlusion. Started on heparin 600 units/hr at 0300 this morning. Now transitioning to heparin per pharmacy. Dosing for afib, MD instructions NOT to bolus patient.   Goal of Therapy:  Heparin level 0.3-0.7 units/ml Monitor platelets by anticoagulation protocol: Yes   Plan:  Will check heparin level now, as patient has been on current rate for ~12 hours. If HL is low, will increase rate without bolus per MD instructions.   Pharmacy to follow per consult  Rexene Edison, PharmD Clinical Pharmacist 08/30/2014 2:43 PM

## 2014-08-30 NOTE — Progress Notes (Signed)
Patient is alert and oriented. Reporting tolerable pain control with PRN medication given x 1. Tolerating diet. Ambulated to hallway today with walker, tolerated well. Dermabond to bilateral groin CDI with bruising around site, stable. Peripheral pulses WNL. To be transferred to [facility], room 208. Report called to Santiago Glad. Will transfer shortly with telemetry.

## 2014-08-30 NOTE — Evaluation (Signed)
Physical Therapy Evaluation Patient Details Name: Lori Roberts MRN: [id number] DOB: 1922-02-12 Today's Date: 08/30/2014   History of Present Illness  Pt with diarrhea 3 weeks ago, started getting weaker with LE issues.  Had b/l thrombolectomy  Clinical Impression  Pt does well with ambulation and though she is normally very active and does not need an AD, she generally is safe with mobility and ambulation.  She is having surgical pain as well as generally feeling weak but is safe and able to participate well on POD1.      Follow Up Recommendations Home health PT    Equipment Recommendations       Recommendations for Other Services       Precautions / Restrictions Precautions Precautions: Fall Restrictions Weight Bearing Restrictions: No      Mobility  Bed Mobility Overal bed mobility: Needs Assistance Bed Mobility: Supine to Sit;Sit to Supine     Supine to sit: Min assist Sit to supine: Min assist      Transfers Overall transfer level: Modified independent Equipment used: Rolling walker (2 wheeled)             General transfer comment: pt needs assist to get to EOB, but is able to rise with supervision only  Ambulation/Gait Ambulation/Gait assistance: Min guard Ambulation Distance (Feet): 35 Feet Assistive device: Rolling walker (2 wheeled)       General Gait Details: Pt is slow and cautios with ambulation, but has no LOBs and is able to maintain balance w/o assist  Stairs            Wheelchair Mobility    Modified Rankin (Stroke Patients Only)       Balance                                             Pertinent Vitals/Pain Pain Assessment:  (minimal pain in b/l groin secondary to sx site)    Home Living Family/patient expects to be discharged to:: Private residence Living Arrangements: Alone (will be going to her niece's house on d/c) Available Help at Discharge: Family Type of Home: Apartment Home Access:   (niece's home has 4 steps to enter)              Prior Function Level of Independence: Independent with assistive device(s)         Comments: Pt uses a 4WW to take out her garbage, etc is able to ambualte w/o AD and is able to go bowling, grocery shop, etc     Hand Dominance        Extremity/Trunk Assessment   Upper Extremity Assessment: Overall WFL for tasks assessed           Lower Extremity Assessment: Overall WFL for tasks assessed (though pain limited with hip flexion acts)         Communication   Communication: No difficulties  Cognition Arousal/Alertness: Awake/alert Behavior During Therapy: WFL for tasks assessed/performed Overall Cognitive Status: Within Functional Limits for tasks assessed                      General Comments      Exercises        Assessment/Plan    PT Assessment Patient needs continued PT services  PT Diagnosis Difficulty walking;Generalized weakness;Acute pain   PT Problem List Decreased strength;Decreased activity tolerance;Decreased balance;Decreased mobility;Pain  PT Treatment Interventions Gait training;Stair training;Functional mobility training;Therapeutic activities;Therapeutic exercise;Balance training   PT Goals (Current goals can be found in the Care Plan section)     Frequency Min 2X/week   Barriers to discharge        Co-evaluation               End of Session Equipment Utilized During Treatment: Gait belt Activity Tolerance: Patient tolerated treatment well;Patient limited by pain Patient left: with nursing/sitter in room           Time: 1140-1210 PT Time Calculation (min) (ACUTE ONLY): 30 min   Charges:   PT Evaluation $Initial PT Evaluation Tier I: 1 Procedure     PT G Codes:       Wayne Both, PT, DPT [phone]  Kreg Shropshire 08/30/2014, 1:16 PM

## 2014-08-30 NOTE — Transfer of Care (Signed)
  Immediate Anesthesia Transfer of Care Note  Patient: Lori Roberts  Procedure(s) Performed: Procedure(s): bilateral aorta iliac thrombectomy, thromboembolectomy, bilateral femoral thromboembolectomy (Bilateral)  Patient Location: PACU  Anesthesia Type:General  Level of Consciousness: sedated  Airway & Oxygen Therapy: Patient Spontanous Breathing and Patient connected to face mask oxygen  Post-op Assessment: Report given to RN and Post -op Vital signs reviewed and stable  Post vital signs: Reviewed and stable  Last Vitals:  Filed Vitals:   08/30/14 0100  BP:   Pulse: 88  Temp: 35.8 C  Resp: 13    Complications: No apparent anesthesia complications

## 2014-08-30 NOTE — Progress Notes (Signed)
Patient alert and oriented received as a transfer from CCU. Groin sites intact with derma bond. Heparin infusing as ordered. Called telemetry and per telemetry clerk  patient is in sinus rhythm at 77.

## 2014-08-31 LAB — CBC
HEMATOCRIT: 25.6 % — AB (ref 35.0–47.0)
Hemoglobin: 8.3 g/dL — ABNORMAL LOW (ref 12.0–16.0)
MCH: 27.7 pg (ref 26.0–34.0)
MCHC: 32.4 g/dL (ref 32.0–36.0)
MCV: 85.4 fL (ref 80.0–100.0)
Platelets: 110 10*3/uL — ABNORMAL LOW (ref 150–440)
RBC: 3 MIL/uL — ABNORMAL LOW (ref 3.80–5.20)
RDW: 16.5 % — AB (ref 11.5–14.5)
WBC: 8.9 10*3/uL (ref 3.6–11.0)

## 2014-08-31 LAB — BASIC METABOLIC PANEL
Anion gap: 6 (ref 5–15)
BUN: 32 mg/dL — ABNORMAL HIGH (ref 6–20)
CO2: 28 mmol/L (ref 22–32)
Calcium: 8.6 mg/dL — ABNORMAL LOW (ref 8.9–10.3)
Chloride: 106 mmol/L (ref 101–111)
Creatinine, Ser: 1.44 mg/dL — ABNORMAL HIGH (ref 0.44–1.00)
GFR calc Af Amer: 36 mL/min — ABNORMAL LOW (ref >60)
GFR calc non Af Amer: 31 mL/min — ABNORMAL LOW (ref >60)
Glucose, Bld: 130 mg/dL — ABNORMAL HIGH (ref 65–99)
Potassium: 3.8 mmol/L (ref 3.5–5.1)
Sodium: 140 mmol/L (ref 135–145)

## 2014-08-31 LAB — APTT: aPTT: 75 seconds — ABNORMAL HIGH (ref 24–36)

## 2014-08-31 LAB — HEPARIN LEVEL (UNFRACTIONATED)
HEPARIN UNFRACTIONATED: 0.31 [IU]/mL (ref 0.30–0.70)
Heparin Unfractionated: 0.22 IU/mL — ABNORMAL LOW (ref 0.30–0.70)

## 2014-08-31 LAB — PROTIME-INR
INR: 1.27
PROTHROMBIN TIME: 16.1 s — AB (ref 11.4–15.0)

## 2014-08-31 MED ORDER — WARFARIN SODIUM 3 MG PO TABS
5.0000 mg | ORAL_TABLET | Freq: Every day | ORAL | Status: DC
  Administered 2014-08-31 – 2014-09-01 (×2): 5 mg via ORAL
  Filled 2014-08-31 (×2): qty 1

## 2014-08-31 MED ORDER — FERROUS SULFATE 325 (65 FE) MG PO TABS
325.0000 mg | ORAL_TABLET | Freq: Two times a day (BID) | ORAL | Status: DC
  Administered 2014-08-31 – 2014-09-06 (×11): 325 mg via ORAL
  Filled 2014-08-31 (×11): qty 1

## 2014-08-31 NOTE — Progress Notes (Signed)
ANTICOAGULATION CONSULT NOTE - Follow Up Consult  Pharmacy Consult for Heparin Indication: atrial fibrillation  Allergies  Allergen Reactions  . Wool Alcohol [Lanolin] Swelling  . Azor [Amlodipine-Olmesartan] Rash  . Clindamycin/Lincomycin Rash  . Iodine Rash  . Mebaral [Mephobarbital] Rash  . Penicillins Rash  . Sulfa Antibiotics Rash    Patient Measurements: Height: 5\' 5"  (165.1 cm) Weight: 152 lb 1.9 oz (69 kg) IBW/kg (Calculated) : 57 Heparin Dosing Weight: 69 kg  Vital Signs: Temp: 97.6 F (36.4 C) (06/12 0431) Temp Source: Oral (06/12 0431) BP: 136/61 mmHg (06/12 0431) Pulse Rate: 63 (06/12 0431)  Labs:  Recent Labs  08/29/14 1843 08/30/14 0915 08/30/14 1450 08/30/14 2246 08/31/14 0708  HGB 10.8* 10.5*  --  9.3* 8.3*  HCT 33.3* 32.4*  --  28.6* 25.6*  PLT 132* 129*  --  131* 110*  APTT 27  --  68*  --  75*  LABPROT 14.5  --   --   --   --   INR 1.11  --   --   --   --   HEPARINUNFRC  --   --  0.42 0.11* 0.22*  CREATININE 1.37* 1.18*  --  1.36* 1.44*  TROPONINI 0.11*  --   --   --   --     Estimated Creatinine Clearance: 25.3 mL/min (by C-G formula based on Cr of 1.44).   Medications:  Scheduled:  . amLODipine  5 mg Oral Daily  . atorvastatin  20 mg Oral Daily  . darifenacin  7.5 mg Oral Daily  . docusate sodium  100 mg Oral Daily  . dorzolamide-timolol  1 drop Both Eyes TID  . hydrALAZINE  25 mg Oral Daily  . irbesartan  37.5 mg Oral Daily  . isosorbide dinitrate  30 mg Oral TID  . latanoprost  1 drop Both Eyes QHS  . pantoprazole  40 mg Oral Daily  . polyethylene glycol  17 g Oral Daily  . sotalol  80 mg Oral BID   Infusions:  . sodium chloride 10 mL/hr at 08/30/14 1656  . heparin 600 Units/hr (08/30/14 1656)   PRN: acetaminophen **OR** acetaminophen, alum & mag hydroxide-simeth, guaiFENesin-dextromethorphan, magnesium sulfate 1 - 4 g bolus IVPB, morphine injection, ondansetron, oxyCODONE, phenol, potassium chloride,  senna-docusate  Assessment: Patient previously on heparin per vascular surgery after repair of acute aortic occlusion. Dosing for afib, MD instructions NOT to bolus patient. Per RN, heparin stopped for a few hours overnight due to access, resumed after 11pm.   Goal of Therapy:  Heparin level 0.3-0.7 units/ml Monitor platelets by anticoagulation protocol: Yes   Plan:  Will increase rate to 700 units/hr without bolus (per MD orders) and recheck in 8 hours.  Pharmacy to follow per consult  Rexene Edison, PharmD Clinical Pharmacist  08/31/2014,7:47 AM

## 2014-08-31 NOTE — Progress Notes (Signed)
ANTICOAGULATION CONSULT NOTE - Initial Consult  Pharmacy Consult for warfarin Indication: atrial fibrillation  Allergies  Allergen Reactions  . Wool Alcohol [Lanolin] Swelling  . Azor [Amlodipine-Olmesartan] Rash  . Clindamycin/Lincomycin Rash  . Iodine Rash  . Mebaral [Mephobarbital] Rash  . Penicillins Rash  . Sulfa Antibiotics Rash    Patient Measurements: Height: 5\' 5"  (165.1 cm) Weight: 152 lb 1.9 oz (69 kg) IBW/kg (Calculated) : 57   Vital Signs: Temp: 98.7 F (37.1 C) (06/12 1240) Temp Source: Oral (06/12 1240) BP: 132/56 mmHg (06/12 1240) Pulse Rate: 73 (06/12 1240)  Labs:  Recent Labs  08/29/14 1843 08/30/14 0915 08/30/14 1450 08/30/14 2246 08/31/14 0708  HGB 10.8* 10.5*  --  9.3* 8.3*  HCT 33.3* 32.4*  --  28.6* 25.6*  PLT 132* 129*  --  131* 110*  APTT 27  --  68*  --  75*  LABPROT 14.5  --   --   --  16.1*  INR 1.11  --   --   --  1.27  HEPARINUNFRC  --   --  0.42 0.11* 0.22*  CREATININE 1.37* 1.18*  --  1.36* 1.44*  TROPONINI 0.11*  --   --   --   --     Estimated Creatinine Clearance: 25.3 mL/min (by C-G formula based on Cr of 1.44).   Medical History: Past Medical History  Diagnosis Date  . Heart trouble   . Arthritis   . HBP (high blood pressure)   . Atrial fibrillation   . PVD (peripheral vascular disease)   . Glaucoma   . Hyperlipidemia   . GERD (gastroesophageal reflux disease)     Medications:  Scheduled:  . amLODipine  5 mg Oral Daily  . atorvastatin  20 mg Oral Daily  . darifenacin  7.5 mg Oral Daily  . docusate sodium  100 mg Oral Daily  . dorzolamide-timolol  1 drop Both Eyes TID  . ferrous sulfate  325 mg Oral BID WC  . hydrALAZINE  25 mg Oral Daily  . irbesartan  37.5 mg Oral Daily  . isosorbide dinitrate  30 mg Oral TID  . latanoprost  1 drop Both Eyes QHS  . pantoprazole  40 mg Oral Daily  . polyethylene glycol  17 g Oral Daily  . sotalol  80 mg Oral BID  . warfarin  5 mg Oral q1800    Assessment: Patient  with atrial fibrillation, receiving warfarin chronically for condition. Patient is currently on heparin gtt. Previously had vascular surgery  Goal of Therapy:  INR 2-3 Monitor platelets by anticoagulation protocol: Yes   Plan:  Will restart home dose of warfarin 5 mg PO daily. PT/INR ordered with am labs.   Foster Sonnier D 08/31/2014,2:06 PM

## 2014-08-31 NOTE — Progress Notes (Signed)
    Subjective  - POD #2, s/p repair of acute aortic occlusion  Walked up 12 stairs.  Minimal pain.  Positive flatus No leg pain   Physical Exam:  Femoral incisions soft and clean Feet warm and well perfused, trace edema No  Motor or sensory deficits in bilateral lower extremity Compartments soft       Assessment/Plan:  POD #2  D/c foley Continue with mobilization Likely home in 1-2 days,  Coumadin started, continue heparin Acute renal insufficiency, monitor creatinine with repeat labs in am  Lori Roberts, [place] 08/31/2014 4:46 PM --  Danley Danker Vitals:   08/31/14 1600  BP: 139/68  Pulse: 65  Temp: 98.5 F (36.9 C)  Resp: 17    Intake/Output Summary (Last 24 hours) at 08/31/14 1646 Last data filed at 08/31/14 1603  Gross per 24 hour  Intake 608.68 ml  Output    675 ml  Net -66.32 ml     Laboratory CBC    Component Value Date/Time   WBC 8.9 08/31/2014 0708   WBC 7.0 01/10/2014 1528   HGB 8.3* 08/31/2014 0708   HGB 13.0 01/10/2014 1528   HCT 25.6* 08/31/2014 0708   HCT 39.5 01/10/2014 1528   PLT 110* 08/31/2014 0708   PLT 147* 01/10/2014 1528    BMET    Component Value Date/Time   NA 140 08/31/2014 0708   NA 142 01/10/2014 1528   K 3.8 08/31/2014 0708   K 3.8 01/10/2014 1528   CL 106 08/31/2014 0708   CL 106 01/10/2014 1528   CO2 28 08/31/2014 0708   CO2 31 01/10/2014 1528   GLUCOSE 130* 08/31/2014 0708   GLUCOSE 144* 01/10/2014 1528   BUN 32* 08/31/2014 0708   BUN 37* 01/10/2014 1528   CREATININE 1.44* 08/31/2014 0708   CREATININE 1.87* 01/10/2014 1528   CALCIUM 8.6* 08/31/2014 0708   CALCIUM 9.0 01/10/2014 1528   GFRNONAA 31* 08/31/2014 0708   GFRAA 36* 08/31/2014 0708    COAG Lab Results  Component Value Date   INR 1.27 08/31/2014   INR 1.11 08/29/2014   INR 1.40 08/17/2014   No results found for: PTT  Antibiotics Anti-infectives    None       V. Leia Alf, M.D. [facility] Office:  [phone] Pager:  [phone]

## 2014-08-31 NOTE — Progress Notes (Signed)
Patient ID: Lori Roberts, female   DOB: 08-Apr-1921, 81 y.o.   MRN: [id number] [facility] PROGRESS NOTE  PCP: Cletis Athens, MD  HPI/Subjective: Patient with some pain that surgical site which is described as soreness. No bowel movement since surgery. Patient states that she's been in the same down since surgery and wants to get washed up. Offers no other complaints.  Objective: Filed Vitals:   08/31/14 0829  BP: 153/65  Pulse: 71  Temp:   Resp: 17    Intake/Output Summary (Last 24 hours) at 08/31/14 1021 Last data filed at 08/31/14 0434  Gross per 24 hour  Intake 868.26 ml  Output    625 ml  Net 243.26 ml   Filed Weights   08/29/14 1702 08/30/14 0200  Weight: 67.132 kg (148 lb) 69 kg (152 lb 1.9 oz)    ROS: Review of Systems  Constitutional: Negative for fever and chills.  Eyes: Negative for blurred vision.  Respiratory: Negative for cough and shortness of breath.   Cardiovascular: Negative for chest pain.  Gastrointestinal: Positive for constipation. Negative for nausea, vomiting, abdominal pain and diarrhea.  Genitourinary: Negative for dysuria.  Musculoskeletal: Negative for joint pain.  Neurological: Negative for dizziness and headaches.  Endo/Heme/Allergies: Bruises/bleeds easily.   Exam: Physical Exam  HENT:  Nose: No mucosal edema.  Mouth/Throat: No oropharyngeal exudate or posterior oropharyngeal edema.  Eyes: Conjunctivae, EOM and lids are normal. Pupils are equal, round, and reactive to light.  Neck: No JVD present. Carotid bruit is not present. No edema present. No thyroid mass and no thyromegaly present.  Cardiovascular: S1 normal and S2 normal.  Exam reveals no gallop.   Murmur heard.  Systolic murmur is present with a grade of 3/6  Pulses:      Dorsalis pedis pulses are 2+ on the right side, and 2+ on the left side.  Respiratory: No respiratory distress. She has no wheezes. She has no rhonchi. She has no rales.  GI: Soft. Bowel sounds  are normal. There is no tenderness.  Musculoskeletal:       Right ankle: She exhibits swelling.       Left ankle: She exhibits swelling.  Lymphadenopathy:    She has no cervical adenopathy.  Neurological: She is alert. No cranial nerve deficit.  Skin: Skin is warm. No rash noted. Nails show no clubbing.  Bilateral groin incision site clean and dry.  Psychiatric: She has a normal mood and affect.   Data Reviewed: Basic Metabolic Panel:  Recent Labs Lab 08/29/14 1843 08/30/14 0915 08/30/14 2246 08/31/14 0708  NA 138 141 139 140  K 3.4* 3.7 4.0 3.8  CL 102 106 106 106  CO2 25 28 24 28   GLUCOSE 114* 134* 133* 130*  BUN 30* 25* 29* 32*  CREATININE 1.37* 1.18* 1.36* 1.44*  CALCIUM 9.1 8.3* 8.4* 8.6*  MG 1.8  --   --   --    Liver Function Tests:  Recent Labs Lab 08/29/14 1843 08/30/14 0915  AST 31 37  ALT 26 25  ALKPHOS 79 72  BILITOT 0.8 0.8  PROT 5.9* 5.6*  ALBUMIN 3.5 3.3*    CBC:  Recent Labs Lab 08/29/14 1843 08/30/14 0915 08/30/14 2246 08/31/14 0708  WBC 6.6 10.0 12.4* 8.9  NEUTROABS 5.2 8.7*  --   --   HGB 10.8* 10.5* 9.3* 8.3*  HCT 33.3* 32.4* 28.6* 25.6*  MCV 85.2 85.3 86.0 85.4  PLT 132* 129* 131* 110*   Cardiac Enzymes:  Recent Labs  Lab 08/29/14 1843  TROPONINI 0.11*   Scheduled Meds: . amLODipine  5 mg Oral Daily  . atorvastatin  20 mg Oral Daily  . darifenacin  7.5 mg Oral Daily  . docusate sodium  100 mg Oral Daily  . dorzolamide-timolol  1 drop Both Eyes TID  . hydrALAZINE  25 mg Oral Daily  . irbesartan  37.5 mg Oral Daily  . isosorbide dinitrate  30 mg Oral TID  . latanoprost  1 drop Both Eyes QHS  . pantoprazole  40 mg Oral Daily  . polyethylene glycol  17 g Oral Daily  . sotalol  80 mg Oral BID  . warfarin  5 mg Oral q1800   Continuous Infusions: . sodium chloride 10 mL/hr at 08/30/14 1656  . heparin 700 Units/hr (08/31/14 0923)    Assessment/Plan:  1. Chronic atrial fibrillation- decreased sotalol dose to 80 mg  twice a day yesterday. Currently on heparin drip. Restart Coumadin 5 mg daily 2. Recent GI bleed, postoperative anemia- continue to monitor closely. Patient is on Protonix empirically. Hemoglobin drifting down likely dilutional and postop anemia. I will start ferrous sulfate. 3. Aortic occlusion- status post bilateral procedures by vascular surgery. Currently on heparin drip. Further management as per vascular surgery. 4. Essential hypertension blood pressure currently stable on usual medications. 5. Hyperlipidemia unspecified on atorvastatin. 6. Glaucoma on specified continue latanoprost. 7. Borderline elevated troponin likely demand ischemia. 8. Hypokalemia -replaced  Code Status:     Code Status Orders        Start     Ordered   08/30/14 0217  Full code   Continuous     08/30/14 0216    Advance Directive Documentation        Most Recent Value   Type of Advance Directive  Living will   Pre-existing out of facility DNR order (yellow form or pink MOST form)     "MOST" Form in Place?       Disposition Plan: Potentially home.  Time spent: 20 minutes.  Loletha Grayer  [facility]

## 2014-09-01 ENCOUNTER — Encounter: Payer: Self-pay | Admitting: Surgery

## 2014-09-01 LAB — CBC WITH DIFFERENTIAL/PLATELET
BASOS PCT: 0 %
Basophils Absolute: 0 10*3/uL (ref 0–0.1)
Eosinophils Absolute: 0 10*3/uL (ref 0–0.7)
Eosinophils Relative: 0 %
HCT: 25.4 % — ABNORMAL LOW (ref 35.0–47.0)
Hemoglobin: 8.4 g/dL — ABNORMAL LOW (ref 12.0–16.0)
Lymphocytes Relative: 9 %
Lymphs Abs: 0.8 10*3/uL — ABNORMAL LOW (ref 1.0–3.6)
MCH: 28.4 pg (ref 26.0–34.0)
MCHC: 33 g/dL (ref 32.0–36.0)
MCV: 85.9 fL (ref 80.0–100.0)
Monocytes Absolute: 1.1 10*3/uL — ABNORMAL HIGH (ref 0.2–0.9)
Monocytes Relative: 12 %
NEUTROS PCT: 79 %
Neutro Abs: 7.2 10*3/uL — ABNORMAL HIGH (ref 1.4–6.5)
PLATELETS: 109 10*3/uL — AB (ref 150–440)
RBC: 2.96 MIL/uL — ABNORMAL LOW (ref 3.80–5.20)
RDW: 16.4 % — ABNORMAL HIGH (ref 11.5–14.5)
WBC: 9.2 10*3/uL (ref 3.6–11.0)

## 2014-09-01 LAB — BASIC METABOLIC PANEL
Anion gap: 5 (ref 5–15)
BUN: 28 mg/dL — AB (ref 6–20)
CALCIUM: 8.5 mg/dL — AB (ref 8.9–10.3)
CO2: 26 mmol/L (ref 22–32)
CREATININE: 1.43 mg/dL — AB (ref 0.44–1.00)
Chloride: 106 mmol/L (ref 101–111)
GFR, EST AFRICAN AMERICAN: 36 mL/min — AB (ref >60)
GFR, EST NON AFRICAN AMERICAN: 31 mL/min — AB (ref >60)
Glucose, Bld: 123 mg/dL — ABNORMAL HIGH (ref 65–99)
Potassium: 3.6 mmol/L (ref 3.5–5.1)
Sodium: 137 mmol/L (ref 135–145)

## 2014-09-01 LAB — PROTIME-INR
INR: 1.24
Prothrombin Time: 15.8 seconds — ABNORMAL HIGH (ref 11.4–15.0)

## 2014-09-01 LAB — CBC
HEMATOCRIT: 25.8 % — AB (ref 35.0–47.0)
Hemoglobin: 8.4 g/dL — ABNORMAL LOW (ref 12.0–16.0)
MCH: 27.7 pg (ref 26.0–34.0)
MCHC: 32.4 g/dL (ref 32.0–36.0)
MCV: 85.4 fL (ref 80.0–100.0)
Platelets: 116 10*3/uL — ABNORMAL LOW (ref 150–440)
RBC: 3.02 MIL/uL — ABNORMAL LOW (ref 3.80–5.20)
RDW: 16.1 % — ABNORMAL HIGH (ref 11.5–14.5)
WBC: 9.8 10*3/uL (ref 3.6–11.0)

## 2014-09-01 LAB — APTT: APTT: 89 s — AB (ref 24–36)

## 2014-09-01 LAB — HEPARIN LEVEL (UNFRACTIONATED)
HEPARIN UNFRACTIONATED: 0.33 [IU]/mL (ref 0.30–0.70)
Heparin Unfractionated: 0.35 IU/mL (ref 0.30–0.70)

## 2014-09-01 MED ORDER — LACTULOSE 10 GM/15ML PO SOLN
30.0000 g | Freq: Once | ORAL | Status: AC
  Administered 2014-09-01: 30 g via ORAL
  Filled 2014-09-01: qty 60

## 2014-09-01 NOTE — Progress Notes (Signed)
Patient ID: BLAKE GOYA, female   DOB: [birth year], 81 y.o.   MRN: [id number] Patient ID: FERRIN LIEBIG, female   DOB: 05-26-1921, 81 y.o.   MRN: [id number] [facility] PROGRESS NOTE  PCP: Cletis Athens, MD  HPI/Subjective: Patient still with some soreness in the lower abdomen. She has not had a bowel movement yet. She feels okay. Poor appetite.  Objective: Filed Vitals:   09/01/14 0751  BP: 121/62  Pulse: 57  Temp: 98.9 F (37.2 C)  Resp: 16    Intake/Output Summary (Last 24 hours) at 09/01/14 1008 Last data filed at 09/01/14 0751  Gross per 24 hour  Intake 677.78 ml  Output    700 ml  Net -22.22 ml   Filed Weights   08/29/14 1702 08/30/14 0200  Weight: 67.132 kg (148 lb) 69 kg (152 lb 1.9 oz)    ROS: Review of Systems  Constitutional: Negative for fever and chills.  Eyes: Negative for blurred vision.  Respiratory: Negative for cough and shortness of breath.   Cardiovascular: Negative for chest pain.  Gastrointestinal: Positive for constipation. Negative for nausea, vomiting, abdominal pain and diarrhea.  Genitourinary: Negative for dysuria.  Musculoskeletal: Negative for joint pain.  Neurological: Negative for dizziness and headaches.  Endo/Heme/Allergies: Bruises/bleeds easily.   Exam: Physical Exam  HENT:  Nose: No mucosal edema.  Mouth/Throat: No oropharyngeal exudate or posterior oropharyngeal edema.  Eyes: Conjunctivae, EOM and lids are normal. Pupils are equal, round, and reactive to light.  Neck: No JVD present. Carotid bruit is not present. No edema present. No thyroid mass and no thyromegaly present.  Cardiovascular: S1 normal and S2 normal.  Exam reveals no gallop.   Murmur heard.  Systolic murmur is present with a grade of 3/6  Pulses:      Dorsalis pedis pulses are 2+ on the right side, and 2+ on the left side.  Respiratory: No respiratory distress. She has no wheezes. She has no rhonchi. She has no rales.  GI: Soft. Bowel sounds are  normal. There is no tenderness.  Musculoskeletal:       Right ankle: She exhibits swelling.       Left ankle: She exhibits swelling.  Lymphadenopathy:    She has no cervical adenopathy.  Neurological: She is alert. No cranial nerve deficit.  Skin: Skin is warm. No rash noted. Nails show no clubbing.  Bilateral groin incision site clean and dry.  Psychiatric: She has a normal mood and affect.   Data Reviewed: Basic Metabolic Panel:  Recent Labs Lab 08/29/14 1843 08/30/14 0915 08/30/14 2246 08/31/14 0708 09/01/14 0227  NA 138 141 139 140 137  K 3.4* 3.7 4.0 3.8 3.6  CL 102 106 106 106 106  CO2 25 28 24 28 26   GLUCOSE 114* 134* 133* 130* 123*  BUN 30* 25* 29* 32* 28*  CREATININE 1.37* 1.18* 1.36* 1.44* 1.43*  CALCIUM 9.1 8.3* 8.4* 8.6* 8.5*  MG 1.8  --   --   --   --    Liver Function Tests:  Recent Labs Lab 08/29/14 1843 08/30/14 0915  AST 31 37  ALT 26 25  ALKPHOS 79 72  BILITOT 0.8 0.8  PROT 5.9* 5.6*  ALBUMIN 3.5 3.3*    CBC:  Recent Labs Lab 08/29/14 1843 08/30/14 0915 08/30/14 2246 08/31/14 0708 09/01/14 0227 09/01/14 0437  WBC 6.6 10.0 12.4* 8.9 9.8 9.2  NEUTROABS 5.2 8.7*  --   --   --  7.2*  HGB 10.8* 10.5* 9.3*  8.3* 8.4* 8.4*  HCT 33.3* 32.4* 28.6* 25.6* 25.8* 25.4*  MCV 85.2 85.3 86.0 85.4 85.4 85.9  PLT 132* 129* 131* 110* 116* 109*   Scheduled Meds: . amLODipine  5 mg Oral Daily  . atorvastatin  20 mg Oral Daily  . darifenacin  7.5 mg Oral Daily  . docusate sodium  100 mg Oral Daily  . dorzolamide-timolol  1 drop Both Eyes TID  . ferrous sulfate  325 mg Oral BID WC  . hydrALAZINE  25 mg Oral Daily  . irbesartan  37.5 mg Oral Daily  . isosorbide dinitrate  30 mg Oral TID  . latanoprost  1 drop Both Eyes QHS  . pantoprazole  40 mg Oral Daily  . polyethylene glycol  17 g Oral Daily  . sotalol  80 mg Oral BID  . warfarin  5 mg Oral q1800   Continuous Infusions: . sodium chloride 10 mL/hr at 08/31/14 1238  . heparin 700 Units/hr  (08/31/14 1238)    Assessment/Plan:  1. Chronic atrial fibrillation/ bradycardia- decreased sotalol dose to 80 mg twice a day yesterday. Currently on heparin drip.  Coumadin 5 mg daily. 2. Recent GI bleed, postoperative anemia- continue to monitor closely. Patient is on Protonix empirically. Hemoglobin drifting down likely dilutional and postop anemia. Started ferrous sulfate. 3. Aortic occlusion- status post bilateral procedures by vascular surgery. Currently on heparin drip. Further management as per vascular surgery. Likely home when INR therapeutic on Coumadin. 4. Essential hypertension blood pressure currently stable on usual medications. 5. Hyperlipidemia unspecified on atorvastatin. 6. Glaucoma on specified continue latanoprost. 7. Borderline elevated troponin likely demand ischemia. 8. Hypokalemia -replaced  Code Status:     Code Status Orders        Start     Ordered   08/30/14 0217  Full code   Continuous     08/30/14 0216    Advance Directive Documentation        Most Recent Value   Type of Advance Directive  Living will   Pre-existing out of facility DNR order (yellow form or pink MOST form)     "MOST" Form in Place?       Disposition Plan: Potentially home.  Time spent: 20 minutes.  Case discussed with niece at the bedside.  Loletha Grayer  [facility]

## 2014-09-01 NOTE — Care Management Note (Addendum)
Case Management Note  Patient Details  Name: Lori Roberts MRN: [id number] Date of Birth: October 12, 1921  Subjective/Objective:      Lurlean Leyden from [place] reports that [name]'s insurance has expired. This Probation officer left a voice message for Elliot Gault [phone]) in Patient [place] at [facility] to please discuss insurance options with [name].  Medicare number is [id number] on [name]'s Medicare A&B card.             Action/Plan:   Expected Discharge Date:                  Expected Discharge Plan:  [place]  In-House Referral:  NA  Discharge planning Services     Post Acute Care Choice:  NA Choice offered to:  Patient  DME Arranged:  N/A DME Agency:  [place]:    [place] Agency:  [place]  Status of Service:  In process, will continue to follow  Medicare Important Message Given:  Yes Date Medicare IM Given:  09/01/14 Medicare IM give by:  LR Case manager Date Additional Medicare IM Given:    Additional Medicare Important Message give by:     If discussed at [place] of Stay Meetings, dates discussed:    Additional Comments:  Rhenda [place] A, RN 09/01/2014, 12:36 PM

## 2014-09-01 NOTE — Progress Notes (Signed)
[facility]  Daily Progress Note   Subjective: Patient is POD #3 s/p bilateral femoral artery exposure, aorto bi-iliac thromboembolectomy and bilateral femoral-popliteal thromboembolectomy. Patient is without complaint. States numbness and weakness in her bilateral extremities have improved since surgery. Denies pain. Poor appetite, OOB and walking with PT today. (+) urine, (+) flatus - no bowel movement.   Objective: Filed Vitals:   09/01/14 0031 09/01/14 0751 09/01/14 1514 09/01/14 1623  BP: 122/55 121/62 142/54 126/55  Pulse: 66 57 68 73  Temp: 98.7 F (37.1 C) 98.9 F (37.2 C)  98.4 F (36.9 C)  TempSrc: Oral Oral  Oral  Resp: 16 16  17   Height:      Weight:      SpO2: 92% 95% 94% 93%    Intake/Output Summary (Last 24 hours) at 09/01/14 1831 Last data filed at 09/01/14 1300  Gross per 24 hour  Intake 1049.36 ml  Output    500 ml  Net 549.36 ml    Physical Exam: A&Ox3, NAD CV: RRR Pulmonary: CTA Bilaterally Abdomen: Soft, Nontender, Nondistended Vascular:  Right: Groin Incision: Dermabond intact, clean and dry, no signs of infection noted. No drainage noted. Some ecchymosis surrounding incision, no hematoma. Thigh soft, calf soft, foot cooler to touch however dopplerable PT and DT. Neuro, Motor and sensory intact.  Left: Groin Incision: Dermabond intact, clean and dry, no signs of infection noted. No drainage noted. Some ecchymosis surrounding incision, no hematoma. Thigh soft, calf soft, foot cooler to touch however dopplerable PT and DT. Neuro, Motor and sensory intact.   Laboratory: CBC    Component Value Date/Time   WBC 9.2 09/01/2014 0437   WBC 7.0 01/10/2014 1528   HGB 8.4* 09/01/2014 0437   HGB 13.0 01/10/2014 1528   HCT 25.4* 09/01/2014 0437   HCT 39.5 01/10/2014 1528   PLT 109* 09/01/2014 0437   PLT 147* 01/10/2014 1528    BMET    Component Value Date/Time   NA 137 09/01/2014 0227   NA 142 01/10/2014 1528   K 3.6 09/01/2014  0227   K 3.8 01/10/2014 1528   CL 106 09/01/2014 0227   CL 106 01/10/2014 1528   CO2 26 09/01/2014 0227   CO2 31 01/10/2014 1528   GLUCOSE 123* 09/01/2014 0227   GLUCOSE 144* 01/10/2014 1528   BUN 28* 09/01/2014 0227   BUN 37* 01/10/2014 1528   CREATININE 1.43* 09/01/2014 0227   CREATININE 1.87* 01/10/2014 1528   CALCIUM 8.5* 09/01/2014 0227   CALCIUM 9.0 01/10/2014 1528   GFRNONAA 31* 09/01/2014 0227   GFRAA 36* 09/01/2014 0227    Assessment/Planning: Patient is POD #3 s/p bilateral femoral artery exposure, aorto bi-iliac thromboembolectomy and bilateral femoral-popliteal thromboembolectomy - doing well. 1) From vascular standpoint patient can be discharged when therapeutic on coumadin. 2) Patient to follow up in our office in 3-4 weeks with aorto-iliac and lower extremity arterial duplex.  Marcelle Overlie PA-C 09/01/2014 6:31 PM

## 2014-09-01 NOTE — Discharge Instructions (Addendum)
Information on my medicine - Coumadin   (Warfarin)  This medication education was reviewed with me or my healthcare representative as part of my discharge preparation.  The pharmacist that spoke with me during my hospital stay was:  Rocky Morel, [place]  Why was Coumadin prescribed for you? Coumadin was prescribed for you because you have a blood clot or a medical condition that can cause an increased risk of forming blood clots. Blood clots can cause serious health problems by blocking the flow of blood to the heart, lung, or brain. Coumadin can prevent harmful blood clots from forming. As a reminder your indication for Coumadin is:   Stroke Prevention Because Of Atrial Fibrillation  What test will check on my response to Coumadin? While on Coumadin (warfarin) you will need to have an INR test regularly to ensure that your dose is keeping you in the desired range. The INR (international normalized ratio) number is calculated from the result of the laboratory test called prothrombin time (PT).  If an INR APPOINTMENT HAS NOT ALREADY BEEN MADE FOR YOU please schedule an appointment to have this lab work done by your health care provider within 7 days. Your INR goal is usually a number between:  2 to 3 or your provider may give you a more narrow range like 2-2.5.  Ask your health care provider during an office visit what your goal INR is.  What  do you need to  know  About  COUMADIN? Take Coumadin (warfarin) exactly as prescribed by your healthcare provider about the same time each day.  DO NOT stop taking without talking to the doctor who prescribed the medication.  Stopping without other blood clot prevention medication to take the place of Coumadin may increase your risk of developing a new clot or stroke.  Get refills before you run out.  What do you do if you miss a dose? If you miss a dose, take it as soon as you remember on the same day then continue your regularly scheduled regimen the next  day.  Do not take two doses of Coumadin at the same time.  Important Safety Information A possible side effect of Coumadin (Warfarin) is an increased risk of bleeding. You should call your healthcare provider right away if you experience any of the following: ? Bleeding from an injury or your nose that does not stop. ? Unusual colored urine (red or dark brown) or unusual colored stools (red or black). ? Unusual bruising for unknown reasons. ? A serious fall or if you hit your head (even if there is no bleeding).  Some foods or medicines interact with Coumadin (warfarin) and might alter your response to warfarin. To help avoid this: ? Eat a balanced diet, maintaining a consistent amount of Vitamin K. ? Notify your provider about major diet changes you plan to make. ? Avoid alcohol or limit your intake to 1 drink for women and 2 drinks for men per day. (1 drink is 5 oz. wine, 12 oz. beer, or 1.5 oz. liquor.)  Make sure that ANY health care provider who prescribes medication for you knows that you are taking Coumadin (warfarin).  Also make sure the healthcare provider who is monitoring your Coumadin knows when you have started a new medication including herbals and non-prescription products.  Coumadin (Warfarin)  Major Drug Interactions  Increased Warfarin Effect Decreased Warfarin Effect  Alcohol (large quantities) Antibiotics (esp. Septra/Bactrim, Flagyl, Cipro) Amiodarone (Cordarone) Aspirin (ASA) Cimetidine (Tagamet) Megestrol (Megace) NSAIDs (ibuprofen,  naproxen, etc.) Piroxicam (Feldene) Propafenone (Rythmol SR) Propranolol (Inderal) Isoniazid (INH) Posaconazole (Noxafil) Barbiturates (Phenobarbital) Carbamazepine (Tegretol) Chlordiazepoxide (Librium) Cholestyramine (Questran) Griseofulvin Oral Contraceptives Rifampin Sucralfate (Carafate) Vitamin K   Coumadin (Warfarin) Major Herbal Interactions  Increased Warfarin Effect Decreased Warfarin Effect   Garlic Ginseng Ginkgo biloba Coenzyme Q10 Green tea St. Johns wort    Coumadin (Warfarin) FOOD Interactions  Eat a consistent number of servings per week of foods HIGH in Vitamin K (1 serving =  cup)  Collards (cooked, or boiled & drained) Kale (cooked, or boiled & drained) Mustard greens (cooked, or boiled & drained) Parsley *serving size only =  cup Spinach (cooked, or boiled & drained) Swiss chard (cooked, or boiled & drained) Turnip greens (cooked, or boiled & drained)  Eat a consistent number of servings per week of foods MEDIUM-HIGH in Vitamin K (1 serving = 1 cup)  Asparagus (cooked, or boiled & drained) Broccoli (cooked, boiled & drained, or raw & chopped) Brussel sprouts (cooked, or boiled & drained) *serving size only =  cup Lettuce, raw (green leaf, endive, romaine) Spinach, raw Turnip greens, raw & chopped   These websites have more information on Coumadin (warfarin):  FailFactory.se; VeganReport.com.au;  1) You may shower. Keep incisions clean and dry. Do not bath or submerge in water until cleared by vascular surgeons. 2) Tylenol for pain control.  2) Continue to increase your fluid and food intake. Continue ensure at least twice a day between meals. 3) You MUST follow up with your PCP Monday or Tuesday to have your INR monitored / Afib follow up. 4) You are being discharged home with home health services.

## 2014-09-01 NOTE — Evaluation (Signed)
Occupational Therapy Evaluation Patient Details Name: Lori Roberts MRN: [id number] DOB: 02-09-1922 Today's Date: 09/01/2014    History of Present Illness This patient is a 81 year old female who came to [facility] with weakness and numbness in BLE with out pedal pulse. She had a b/I thrombolectomy.   Clinical Impression   This patient is a 81 year old female who came to [facility] with weakness and numbness in bilateral lower extremities. She received a b/l thrombolectomy. She lives alone and had been independent with activities of daily living and functional mobility. She now needs assist and would benefit from Occupational Therapy for ADL/functioal mobility training. She was unable to reach here feet for lower body dressing. Patient practiced Donned/doffed socks and pants to knees using reacher and sock aid with hand over hand assist to illustrate there use.      Follow Up Recommendations  Home health OT    Equipment Recommendations       Recommendations for Other Services       Precautions / Restrictions Precautions Precautions: Fall Restrictions Weight Bearing Restrictions: No      Mobility Bed Mobility Overal bed mobility: Modified Independent Bed Mobility: Supine to Sit;Sit to Supine     Supine to sit: Modified independent (Device/Increase time) Sit to supine: Modified independent (Device/Increase time)      Transfers                      Balance                                            ADL Overall ADL's : Independent (previously)                                       General ADL Comments: Previously, needs some assist now     Vision     Perception     Praxis      Pertinent Vitals/Pain Pain Assessment:  (Some at sergical site)     Hand Dominance     Extremity/Trunk Assessment Upper Extremity Assessment Upper Extremity Assessment: Overall WFL for tasks assessed            Communication Communication Communication: No difficulties   Cognition Arousal/Alertness: Awake/alert Behavior During Therapy: WFL for tasks assessed/performed Overall Cognitive Status: Within Functional Limits for tasks assessed                     General Comments       Exercises       Shoulder Instructions      Home Living Family/patient expects to be discharged to:: Private residence (Is planning to live with her niece for a few days.) Living Arrangements: Alone (In an appartment with no steps. ) Available Help at Discharge: Family (Niece) Type of Home: Apartment Home Access: Level entry     Home Layout: One level     Bathroom Shower/Tub: Teacher, early years/pre:  (Bedside commode over toilet)     Home Equipment: Cane - single point;Bedside commode;Shower seat;Hand held shower head          Prior Functioning/Environment Level of Independence: Independent with assistive device(s) (Cane for longer distances. Still drives)        Comments: Patient  still bowls and is able to get her own supplies. She eats lunch at a center. Breakfast and dinner is on her own.    OT Diagnosis: Generalized weakness (Resolved lower extremity paresis)   OT Problem List: Decreased strength (ADL deficits)   OT Treatment/Interventions:      OT Goals(Current goals can be found in the care plan section) Acute Rehab OT Goals Patient Stated Goal: To go go niece's house for a few days before returning to apparment. OT Goal Formulation: With patient Time For Goal Achievement: 09/15/14 Potential to Achieve Goals: Good  OT Frequency:     Barriers to D/C:            Co-evaluation              End of Session Equipment Utilized During Treatment:  (Hip kit)  Activity Tolerance:   Patient left: in bed;with call bell/phone within reach;with bed alarm set   Time: 1040-1102 OT Time Calculation (min): 22 min Charges:  OT General Charges $OT Visit: 1  Procedure OT Evaluation $Initial OT Evaluation Tier I: 1 Procedure G-Codes:    Myrene Galas, MS/OTR/L  09/01/2014, 11:26 AM

## 2014-09-01 NOTE — Progress Notes (Addendum)
ANTICOAGULATION CONSULT NOTE - Follow Up Consult  Pharmacy Consult for Heparin Indication: atrial fibrillation  Allergies  Allergen Reactions  . Wool Alcohol [Lanolin] Swelling  . Azor [Amlodipine-Olmesartan] Rash  . Clindamycin/Lincomycin Rash  . Iodine Rash  . Mebaral [Mephobarbital] Rash  . Penicillins Rash  . Sulfa Antibiotics Rash    Patient Measurements: Height: 5\' 5"  (165.1 cm) Weight: 152 lb 1.9 oz (69 kg) IBW/kg (Calculated) : 57 Heparin Dosing Weight: 69 kg  Vital Signs: Temp: 98.7 F (37.1 C) (06/13 0031) Temp Source: Oral (06/13 0031) BP: 122/55 mmHg (06/13 0031) Pulse Rate: 66 (06/13 0031)  Labs:  Recent Labs  08/29/14 1843  08/30/14 1450 08/30/14 2246 08/31/14 0708 08/31/14 1638 09/01/14 0227  HGB 10.8*  < >  --  9.3* 8.3*  --  8.4*  HCT 33.3*  < >  --  28.6* 25.6*  --  25.8*  PLT 132*  < >  --  131* 110*  --  116*  APTT 27  --  68*  --  75*  --  89*  LABPROT 14.5  --   --   --  16.1*  --  15.8*  INR 1.11  --   --   --  1.27  --  1.24  HEPARINUNFRC  --   < > 0.42 0.11* 0.22* 0.31 0.35  CREATININE 1.37*  < >  --  1.36* 1.44*  --  1.43*  TROPONINI 0.11*  --   --   --   --   --   --   < > = values in this interval not displayed.  Estimated Creatinine Clearance: 25.5 mL/min (by C-G formula based on Cr of 1.43).   Medications:  Scheduled:  . amLODipine  5 mg Oral Daily  . atorvastatin  20 mg Oral Daily  . darifenacin  7.5 mg Oral Daily  . docusate sodium  100 mg Oral Daily  . dorzolamide-timolol  1 drop Both Eyes TID  . ferrous sulfate  325 mg Oral BID WC  . hydrALAZINE  25 mg Oral Daily  . irbesartan  37.5 mg Oral Daily  . isosorbide dinitrate  30 mg Oral TID  . latanoprost  1 drop Both Eyes QHS  . pantoprazole  40 mg Oral Daily  . polyethylene glycol  17 g Oral Daily  . sotalol  80 mg Oral BID  . warfarin  5 mg Oral q1800   Infusions:  . sodium chloride 10 mL/hr at 08/31/14 1238  . heparin 700 Units/hr (08/31/14 1238)   PRN:  acetaminophen **OR** acetaminophen, alum & mag hydroxide-simeth, guaiFENesin-dextromethorphan, magnesium sulfate 1 - 4 g bolus IVPB, morphine injection, ondansetron, oxyCODONE, phenol, potassium chloride, senna-docusate  Assessment: Patient with a h/o afib on warfarin previously on heparin per vascular surgery after repair of acute aortic occlusion. MD instructions NOT to bolus patient. Patient resumed warfarin today. Two subsequent HL at goal.  Goal of Therapy:  Heparin level 0.3-0.7 units/ml Monitor platelets by anticoagulation protocol: Yes   Plan:  Will continue heparin infusion at 700 units/hr and f/u AM labs.   Ulice Dash D 09/01/2014,3:14 AM     6/14 AM anti-Xa <0.1. Increase rate to 950 units/hr. No bolus as per previous notes Recheck in 8 hours.   Sim Boast, PharmD, BCPS  09/02/2014

## 2014-09-01 NOTE — Progress Notes (Signed)
Physical Therapy Treatment Patient Details Name: Lori Roberts MRN: [id number] DOB: June 07, 1921 Today's Date: 09/01/2014    History of Present Illness This patient is a 81 year old female who came to [facility] with weakness and numbness in BLE with out pedal pulse. She had a b/I thrombolectomy.    PT Comments    Patient states she typically ambulates without an assistive device, however today after standing she insists on holding on to the IV pole with both UEs. Patient displays no loss of balance with IV pole, however she will need gait training with RW prior to discharge to ensure safety at home. Patient displays some mild short term memory loss of she does not remember getting up without anyone during this admission, though she has. Patient appears able to navigate her home environment with a RW, but would benefit from home health PT to help her address her falls risk and decreased endurance. Skilled acute PT services are indicated to address the above deficits.   Follow Up Recommendations  Home health PT     Equipment Recommendations  Rolling walker with 5" wheels    Recommendations for Other Services       Precautions / Restrictions Precautions Precautions: Fall Restrictions Weight Bearing Restrictions: No    Mobility  Bed Mobility Overal bed mobility: Needs Assistance Bed Mobility: Supine to Sit     Supine to sit: Supervision     General bed mobility comments: Verbal cues for use of handrails and assistance to scoot to the edge of bed were required.   Transfers Overall transfer level: Needs assistance   Transfers: Sit to/from Stand Sit to Stand: Min guard         General transfer comment: Patient used IV pole for balance in standing as no walker was present in the room. Patient able to stand without the support of any ADs.   Ambulation/Gait Ambulation/Gait assistance: Min guard Ambulation Distance (Feet): 50 Feet Assistive device:  (IV pole)     Gait velocity  interpretation: <1.8 ft/sec, indicative of risk for recurrent falls General Gait Details: Patient is slow with ambulation with reciprocal gait pattern. Patient is able to maintain standing balance without assistance, but is not comfortable with ambulating without some form of hand held assistance.    Stairs            Wheelchair Mobility    Modified Rankin (Stroke Patients Only)       Balance Overall balance assessment: Needs assistance Sitting-balance support: Feet supported Sitting balance-Leahy Scale: Good     Standing balance support: Bilateral upper extremity supported Standing balance-Leahy Scale: Fair Standing balance comment: Patient is able to stand without HHA but unable to ambulate without HHA.                     Cognition Arousal/Alertness: Awake/alert Behavior During Therapy: WFL for tasks assessed/performed Overall Cognitive Status: Within Functional Limits for tasks assessed                      Exercises Total Joint Exercises Heel Slides: AROM;Both;10 reps Straight Leg Raises: AROM;Both;10 reps Long Arc Quad: AROM;Both;10 reps Other Exercises Other Exercises: Sit to stand training with RW x 5 with cga x 1.     General Comments        Pertinent Vitals/Pain Pain Assessment:  (Patient states she has some pain with knee extension acitivty in supine, but does not complain of symptoms at any other times,.)  Home Living                      Prior Function            PT Goals (current goals can now be found in the care plan section) Acute Rehab PT Goals Patient Stated Goal: To go to niece's house for a few days before returning to apparment. PT Goal Formulation: With patient/family Time For Goal Achievement: 09/13/14 Potential to Achieve Goals: Good Progress towards PT goals: Progressing toward goals    Frequency  Min 2X/week    PT Plan Current plan remains appropriate    Co-evaluation             End of  Session Equipment Utilized During Treatment: Gait belt Activity Tolerance: Patient tolerated treatment well;Patient limited by fatigue Patient left: in chair;with call bell/phone within reach;with chair alarm set     Time: 3403-7096 PT Time Calculation (min) (ACUTE ONLY): 30 min  Charges:  $Gait Training: 8-22 mins $Therapeutic Exercise: 8-22 mins                    G Codes:      Kerman Passey, PT, DPT   09/01/2014, 3:23 PM

## 2014-09-01 NOTE — Progress Notes (Addendum)
ANTICOAGULATION CONSULT NOTE - Follow up [place] for warfarin Indication: atrial fibrillation/aortic occlusion  Allergies  Allergen Reactions  . Wool Alcohol [Lanolin] Swelling  . Azor [Amlodipine-Olmesartan] Rash  . Clindamycin/Lincomycin Rash  . Iodine Rash  . Mebaral [Mephobarbital] Rash  . Penicillins Rash  . Sulfa Antibiotics Rash    Patient Measurements: Height: 5\' 5"  (165.1 cm) Weight: 152 lb 1.9 oz (69 kg) IBW/kg (Calculated) : 57   Vital Signs: Temp: 98.9 F (37.2 C) (06/13 0751) Temp Source: Oral (06/13 0751) BP: 121/62 mmHg (06/13 0751) Pulse Rate: 57 (06/13 0751)  Labs:  Recent Labs  08/29/14 1843  08/30/14 1450 08/30/14 2246 08/31/14 0708 08/31/14 1638 09/01/14 0227 09/01/14 0437  HGB 10.8*  < >  --  9.3* 8.3*  --  8.4* 8.4*  HCT 33.3*  < >  --  28.6* 25.6*  --  25.8* 25.4*  PLT 132*  < >  --  131* 110*  --  116* 109*  APTT 27  --  68*  --  75*  --  89*  --   LABPROT 14.5  --   --   --  16.1*  --  15.8*  --   INR 1.11  --   --   --  1.27  --  1.24  --   HEPARINUNFRC  --   < > 0.42 0.11* 0.22* 0.31 0.35 0.33  CREATININE 1.37*  < >  --  1.36* 1.44*  --  1.43*  --   TROPONINI 0.11*  --   --   --   --   --   --   --   < > = values in this interval not displayed.  Estimated Creatinine Clearance: 25.5 mL/min (by C-G formula based on Cr of 1.43).   Medical History: Past Medical History  Diagnosis Date  . Heart trouble   . Arthritis   . HBP (high blood pressure)   . Atrial fibrillation   . PVD (peripheral vascular disease)   . Glaucoma   . Hyperlipidemia   . GERD (gastroesophageal reflux disease)     Medications:  Scheduled:  . amLODipine  5 mg Oral Daily  . atorvastatin  20 mg Oral Daily  . darifenacin  7.5 mg Oral Daily  . docusate sodium  100 mg Oral Daily  . dorzolamide-timolol  1 drop Both Eyes TID  . ferrous sulfate  325 mg Oral BID WC  . hydrALAZINE  25 mg Oral Daily  . irbesartan  37.5 mg Oral Daily  .  isosorbide dinitrate  30 mg Oral TID  . latanoprost  1 drop Both Eyes QHS  . pantoprazole  40 mg Oral Daily  . polyethylene glycol  17 g Oral Daily  . sotalol  80 mg Oral BID  . warfarin  5 mg Oral q1800    Assessment: Patient with atrial fibrillation, receiving warfarin chronically for condition. Patient is currently on heparin gtt. Previously had vascular surgery for acute aortic occlusion.  6/12 INR 1.27 - Warfarin 5 mg 6/13 INR 1.24  Goal of Therapy:  INR 2-3 Monitor platelets by anticoagulation protocol: Yes   Plan:  Will continue warfarin 5 mg PO daily for today - Day #2 overlap with heparin Hgb/Plt about stable from yesterday. PT/INR ordered with am labs.    Rayna Sexton, PharmD, BCPS Clinical Pharmacist 09/01/2014,9:39 AM

## 2014-09-01 NOTE — Care Management Note (Signed)
Case Management Note  Patient Details  Name: Lori Roberts MRN: [id number] Date of Birth: [birth year]  Subjective/Objective:        Discharge planning: 81yo Mrs Lori Roberts  plans to stay with her niece Percell [place] for a week or two after this hospital discharge. Niece's address: [address] [place], [place] [address] ph [phone].  Discussed possible home health services after discharge and [name] chose [facility] to be her provider. She already has a rolling walker, a bedside commode, and a shower chair. Pharmacy=[facility] [facility]. PCP = Dr Rebecka Apley. [name] reports that she has only walked about 12 steps with PT thus far.          Action/Plan: Case Management will follow for discharge planning. [facility] was advised of new temporary address.   Expected Discharge Date:                  Expected Discharge Plan:  [place]  In-House Referral:  NA  Discharge planning Services     Post Acute Care Choice:  NA Choice offered to:  Patient  DME Arranged:  N/A DME Agency:  [place]:    [place] Agency:  [place]  Status of Service:  In process, will continue to follow  Medicare Important Message Given:  Yes Date Medicare IM Given:  09/01/14 Medicare IM give by:  LR Case manager Date Additional Medicare IM Given:    Additional Medicare Important Message give by:     If discussed at [place] of Stay Meetings, dates discussed:    Additional Comments:  Bethan Adamek A, RN 09/01/2014, 9:34 AM

## 2014-09-01 NOTE — Care Management Note (Signed)
Case Management Note  Patient Details  Name: Lori Roberts MRN: [id number] Date of Birth: [birth year]  Subjective/Objective:                    Action/Plan:   Expected Discharge Date:                  Expected Discharge Plan:     In-House Referral:     Discharge planning Services     Post Acute Care Choice:    Choice offered to:     DME Arranged:    DME Agency:     HH Arranged:    [place] Agency:     Status of Service:     Medicare Important Message Given:   yes Date Medicare IM Given:    09/01/14 Medicare IM give by:   MR Case Manager Date Additional Medicare IM Given:    Additional Medicare Important Message give by:     If discussed at [place] of Stay Meetings, dates discussed:    Additional Comments:  [name] A, RN 09/01/2014, 8:49 AM

## 2014-09-02 LAB — CBC
HCT: 26.4 % — ABNORMAL LOW (ref 35.0–47.0)
Hemoglobin: 9 g/dL — ABNORMAL LOW (ref 12.0–16.0)
MCH: 28.4 pg (ref 26.0–34.0)
MCHC: 34 g/dL (ref 32.0–36.0)
MCV: 83.7 fL (ref 80.0–100.0)
PLATELETS: 123 10*3/uL — AB (ref 150–440)
RBC: 3.15 MIL/uL — ABNORMAL LOW (ref 3.80–5.20)
RDW: 15.8 % — AB (ref 11.5–14.5)
WBC: 9.3 10*3/uL (ref 3.6–11.0)

## 2014-09-02 LAB — APTT: APTT: 46 s — AB (ref 24–36)

## 2014-09-02 LAB — HEPARIN LEVEL (UNFRACTIONATED): Heparin Unfractionated: 0.49 IU/mL (ref 0.30–0.70)

## 2014-09-02 LAB — PROTIME-INR
INR: 1.18
Prothrombin Time: 15.2 seconds — ABNORMAL HIGH (ref 11.4–15.0)

## 2014-09-02 LAB — SURGICAL PATHOLOGY

## 2014-09-02 MED ORDER — WARFARIN SODIUM 7.5 MG PO TABS
7.5000 mg | ORAL_TABLET | Freq: Every day | ORAL | Status: DC
  Administered 2014-09-02: 7.5 mg via ORAL
  Filled 2014-09-02 (×3): qty 1

## 2014-09-02 MED ORDER — WARFARIN - PHARMACIST DOSING INPATIENT
Freq: Every day | Status: DC
  Administered 2014-09-04 – 2014-09-05 (×2)

## 2014-09-02 MED ORDER — WARFARIN SODIUM 3 MG PO TABS: 6.0000 mg | ORAL_TABLET | Freq: Every day | ORAL | Status: DC

## 2014-09-02 NOTE — Progress Notes (Addendum)
ANTICOAGULATION CONSULT NOTE - Follow Up Consult  Pharmacy Consult for Heparin Indication: atrial fibrillation  Allergies  Allergen Reactions  . Wool Alcohol [Lanolin] Swelling  . Azor [Amlodipine-Olmesartan] Rash  . Clindamycin/Lincomycin Rash  . Iodine Rash  . Mebaral [Mephobarbital] Rash  . Penicillins Rash  . Sulfa Antibiotics Rash    Patient Measurements: Height: 5\' 5"  (165.1 cm) Weight: 152 lb 1.9 oz (69 kg) IBW/kg (Calculated) : 57 Heparin Dosing Weight: 69 kg  Vital Signs: Temp: 98.7 F (37.1 C) (06/14 0810) Temp Source: Oral (06/14 0810) BP: 171/66 mmHg (06/14 0810) Pulse Rate: 76 (06/14 0810)  Labs:  Recent Labs  08/30/14 2246 08/31/14 0708  09/01/14 0227 09/01/14 0437 09/02/14 0500  HGB 9.3* 8.3*  --  8.4* 8.4* 9.0*  HCT 28.6* 25.6*  --  25.8* 25.4* 26.4*  PLT 131* 110*  --  116* 109* 123*  APTT  --  75*  --  89*  --  46*  LABPROT  --  16.1*  --  15.8*  --  15.2*  INR  --  1.27  --  1.24  --  1.18  HEPARINUNFRC 0.11* 0.22*  < > 0.35 0.33 <0.10*  CREATININE 1.36* 1.44*  --  1.43*  --   --   < > = values in this interval not displayed.  Estimated Creatinine Clearance: 25.5 mL/min (by C-G formula based on Cr of 1.43).   Medications:  Scheduled:  . amLODipine  5 mg Oral Daily  . atorvastatin  20 mg Oral Daily  . darifenacin  7.5 mg Oral Daily  . docusate sodium  100 mg Oral Daily  . dorzolamide-timolol  1 drop Both Eyes TID  . ferrous sulfate  325 mg Oral BID WC  . hydrALAZINE  25 mg Oral Daily  . irbesartan  37.5 mg Oral Daily  . isosorbide dinitrate  30 mg Oral TID  . latanoprost  1 drop Both Eyes QHS  . pantoprazole  40 mg Oral Daily  . polyethylene glycol  17 g Oral Daily  . sotalol  80 mg Oral BID  . warfarin  6 mg Oral q1800  . Warfarin - Pharmacist Dosing Inpatient   Does not apply q1800   Infusions:  . heparin 950 Units/hr (09/02/14 0830)   PRN: acetaminophen **OR** acetaminophen, alum & mag hydroxide-simeth,  guaiFENesin-dextromethorphan, magnesium sulfate 1 - 4 g bolus IVPB, morphine injection, ondansetron, oxyCODONE, phenol, potassium chloride, senna-docusate  Assessment: Patient with a h/o afib on warfarin previously also on heparin per vascular surgery after repair of acute aortic occlusion. MD instructions NOT to bolus patient. Patient resumed warfarin 6/12.   Goal of Therapy:  Heparin level 0.3-0.7 units/ml Monitor platelets by anticoagulation protocol: Yes   Plan:  Per night nurse from last night, no line issues overnight, heparin running at 7 ml/hr.  Hgb/Plt improved/stable from yesterday.  6/14 AM anti-Xa <0.1. Heparin infusion at 700 units/hr.  Increase rate to 950 units/hr. No bolus as per previous notes. Recheck in 8 hours.  6/14:  Heparin level @ 16:30 = 0.49 Will draw confirmation HL on 6/15 @ 00:30.   Anti-Xa level ordered for 1630 today and CBC for tomorrow AM.   Rayna Sexton, PharmD, BCPS Clinical Pharmacist  09/02/2014 9:48 AM     6/15 00:30 anti-Xa 0.67. Cecil-Bishop, PharmD, BCPS  09/03/2014

## 2014-09-02 NOTE — Progress Notes (Signed)
Patient ID: Lori Roberts, female   DOB: [birth year], 81 y.o.   MRN: [id number] [facility] PROGRESS NOTE  PCP: Cletis Athens, MD  HPI/Subjective: Patient felt severe abdominal pain on the lower right side this morning. It was resolved with pain medication. Patient did have a bowel movement yesterday and throughout the night. Still has soreness in the lower abdomen at the surgical site.  Objective: Filed Vitals:   09/02/14 0810  BP: 171/66  Pulse: 76  Temp: 98.7 F (37.1 C)  Resp: 17    Intake/Output Summary (Last 24 hours) at 09/02/14 1108 Last data filed at 09/02/14 0900  Gross per 24 hour  Intake    525 ml  Output    500 ml  Net     25 ml   Filed Weights   08/29/14 1702 08/30/14 0200  Weight: 67.132 kg (148 lb) 69 kg (152 lb 1.9 oz)    ROS: Review of Systems  Constitutional: Negative for fever and chills.  Eyes: Negative for blurred vision.  Respiratory: Negative for cough and shortness of breath.   Cardiovascular: Negative for chest pain.  Gastrointestinal: Positive for abdominal pain and diarrhea. Negative for nausea, vomiting and constipation.  Genitourinary: Negative for dysuria.  Musculoskeletal: Negative for joint pain.  Neurological: Negative for dizziness and headaches.  Endo/Heme/Allergies: Bruises/bleeds easily.   Exam: Physical Exam  HENT:  Nose: No mucosal edema.  Mouth/Throat: No oropharyngeal exudate or posterior oropharyngeal edema.  Eyes: Conjunctivae, EOM and lids are normal. Pupils are equal, round, and reactive to light.  Neck: No JVD present. Carotid bruit is not present. No edema present. No thyroid mass and no thyromegaly present.  Cardiovascular: S1 normal and S2 normal.  Exam reveals no gallop.   Murmur heard.  Systolic murmur is present with a grade of 3/6  Pulses:      Dorsalis pedis pulses are 2+ on the right side, and 2+ on the left side.  Respiratory: No respiratory distress. She has no wheezes. She has no rhonchi. She  has no rales.  GI: Soft. Bowel sounds are normal. There is no tenderness.  Musculoskeletal:       Right ankle: She exhibits swelling.       Left ankle: She exhibits swelling.  Lymphadenopathy:    She has no cervical adenopathy.  Neurological: She is alert. No cranial nerve deficit.  Skin: Skin is warm. No rash noted. Nails show no clubbing.  Bilateral groin incision site clean and dry.  Psychiatric: She has a normal mood and affect.   Data Reviewed: Basic Metabolic Panel:  Recent Labs Lab 08/29/14 1843 08/30/14 0915 08/30/14 2246 08/31/14 0708 09/01/14 0227  NA 138 141 139 140 137  K 3.4* 3.7 4.0 3.8 3.6  CL 102 106 106 106 106  CO2 25 28 24 28 26   GLUCOSE 114* 134* 133* 130* 123*  BUN 30* 25* 29* 32* 28*  CREATININE 1.37* 1.18* 1.36* 1.44* 1.43*  CALCIUM 9.1 8.3* 8.4* 8.6* 8.5*  MG 1.8  --   --   --   --    Liver Function Tests:  Recent Labs Lab 08/29/14 1843 08/30/14 0915  AST 31 37  ALT 26 25  ALKPHOS 79 72  BILITOT 0.8 0.8  PROT 5.9* 5.6*  ALBUMIN 3.5 3.3*    CBC:  Recent Labs Lab 08/29/14 1843 08/30/14 0915 08/30/14 2246 08/31/14 0708 09/01/14 0227 09/01/14 0437 09/02/14 0500  WBC 6.6 10.0 12.4* 8.9 9.8 9.2 9.3  NEUTROABS 5.2 8.7*  --   --   --  7.2*  --   HGB 10.8* 10.5* 9.3* 8.3* 8.4* 8.4* 9.0*  HCT 33.3* 32.4* 28.6* 25.6* 25.8* 25.4* 26.4*  MCV 85.2 85.3 86.0 85.4 85.4 85.9 83.7  PLT 132* 129* 131* 110* 116* 109* 123*   Scheduled Meds: . amLODipine  5 mg Oral Daily  . atorvastatin  20 mg Oral Daily  . darifenacin  7.5 mg Oral Daily  . docusate sodium  100 mg Oral Daily  . dorzolamide-timolol  1 drop Both Eyes TID  . ferrous sulfate  325 mg Oral BID WC  . hydrALAZINE  25 mg Oral Daily  . irbesartan  37.5 mg Oral Daily  . isosorbide dinitrate  30 mg Oral TID  . latanoprost  1 drop Both Eyes QHS  . pantoprazole  40 mg Oral Daily  . polyethylene glycol  17 g Oral Daily  . sotalol  80 mg Oral BID  . warfarin  7.5 mg Oral q1800  .  Warfarin - Pharmacist Dosing Inpatient   Does not apply q1800   Continuous Infusions: . heparin 950 Units/hr (09/02/14 0830)    Assessment/Plan:  1. Chronic atrial fibrillation/ bradycardia- decreased sotalol dose to 80 mg twice a day yesterday. Currently on heparin drip.  Coumadin 7.5 mg today. 2. Recent GI bleed, postoperative anemia- continue to monitor closely. Patient is on Protonix empirically. Hemoglobin drifting down likely dilutional and postop anemia. Started ferrous sulfate. 3. Aortic occlusion- status post bilateral procedures by vascular surgery. Currently on heparin drip. Further management as per vascular surgery. Likely home when INR therapeutic on Coumadin. 4. Essential hypertension blood pressure currently stable on usual medications. 5. Hyperlipidemia unspecified on atorvastatin. 6. Glaucoma on specified continue latanoprost. 7. Borderline elevated troponin likely demand ischemia. 8. Hypokalemia -replaced 9. Abdominal pain this a.m. Better with pain medication. Patient did have bowel movements yesterday and all last night. 10. Thrombocytopenia- looking back at old labs looks chronic.  Code Status:     Code Status Orders        Start     Ordered   08/30/14 0217  Full code   Continuous     08/30/14 0216    Advance Directive Documentation        Most Recent Value   Type of Advance Directive  Living will   Pre-existing out of facility DNR order (yellow form or pink MOST form)     "MOST" Form in Place?       Disposition Plan: Home with home health with niece once INR therapeutic.  Time spent: 20 minutes  Loletha Grayer  [facility]

## 2014-09-02 NOTE — Clinical Social Work Note (Signed)
Clinical Social Worker received consult for pt as her Medicare has expired. CSW spoke with [facility] who is involved and following up. CSW is signing off as no further needs identified. Please reconsult if a need arsises prior to discharge.   Darden Dates, MSW, LCSW Clinical Social Worker  [phone]

## 2014-09-02 NOTE — Progress Notes (Addendum)
ANTICOAGULATION CONSULT NOTE - Follow up [place] for warfarin Indication: atrial fibrillation/aortic occlusion  Allergies  Allergen Reactions  . Wool Alcohol [Lanolin] Swelling  . Azor [Amlodipine-Olmesartan] Rash  . Clindamycin/Lincomycin Rash  . Iodine Rash  . Mebaral [Mephobarbital] Rash  . Penicillins Rash  . Sulfa Antibiotics Rash    Patient Measurements: Height: 5\' 5"  (165.1 cm) Weight: 152 lb 1.9 oz (69 kg) IBW/kg (Calculated) : 57   Vital Signs: Temp: 98.7 F (37.1 C) (06/14 0810) Temp Source: Oral (06/14 0810) BP: 171/66 mmHg (06/14 0810) Pulse Rate: 76 (06/14 0810)  Labs:  Recent Labs  08/30/14 2246 08/31/14 0708  09/01/14 0227 09/01/14 0437 09/02/14 0500  HGB 9.3* 8.3*  --  8.4* 8.4* 9.0*  HCT 28.6* 25.6*  --  25.8* 25.4* 26.4*  PLT 131* 110*  --  116* 109* 123*  APTT  --  75*  --  89*  --  46*  LABPROT  --  16.1*  --  15.8*  --  15.2*  INR  --  1.27  --  1.24  --  1.18  HEPARINUNFRC 0.11* 0.22*  < > 0.35 0.33 <0.10*  CREATININE 1.36* 1.44*  --  1.43*  --   --   < > = values in this interval not displayed.  Estimated Creatinine Clearance: 25.5 mL/min (by C-G formula based on Cr of 1.43).   Medical History: Past Medical History  Diagnosis Date  . Heart trouble   . Arthritis   . HBP (high blood pressure)   . Atrial fibrillation   . PVD (peripheral vascular disease)   . Glaucoma   . Hyperlipidemia   . GERD (gastroesophageal reflux disease)     Medications:  Scheduled:  . amLODipine  5 mg Oral Daily  . atorvastatin  20 mg Oral Daily  . darifenacin  7.5 mg Oral Daily  . docusate sodium  100 mg Oral Daily  . dorzolamide-timolol  1 drop Both Eyes TID  . ferrous sulfate  325 mg Oral BID WC  . hydrALAZINE  25 mg Oral Daily  . irbesartan  37.5 mg Oral Daily  . isosorbide dinitrate  30 mg Oral TID  . latanoprost  1 drop Both Eyes QHS  . pantoprazole  40 mg Oral Daily  . polyethylene glycol  17 g Oral Daily  . sotalol  80  mg Oral BID  . warfarin  6 mg Oral q1800  . Warfarin - Pharmacist Dosing Inpatient   Does not apply q1800    Assessment: Patient with atrial fibrillation, receiving warfarin chronically for condition. Patient is currently on heparin gtt. Previously had vascular surgery for acute aortic occlusion. Pt reports home dose to be warfarin 5 mg daily.   Pt counseled on warfarin on 6/13 - book and handout provided to pt.   6/10 INR 1.11 6/12 INR 1.27 - Warfarin 5 mg 6/13 INR 1.24 - Warfarin 5 mg 6/14 INR 1.18  Goal of Therapy:  INR 2-3 Monitor platelets by anticoagulation protocol: Yes   Plan:  Likely have not seen full effects of doses but INR trending down.Will increase warfarin to 6 mg PO daily for today - Day #3 overlap with heparin Hgb/Plt stable/improved from yesterday. PT/INR ordered with am labs.    Rayna Sexton, PharmD, BCPS Clinical Pharmacist 09/02/2014,9:51 AM   Addendum: MD has increased warfarin dose to warfarin 7.5 mg PO for today (ordered as daily). Will continue MD orders and follow up INR in AM.   Rayna Sexton,  PharmD, BCPS Clinical Pharmacist 09/02/2014 1:30 PM

## 2014-09-02 NOTE — Progress Notes (Signed)
S: The patient notes her feet are still a bit numb but no longer are painful. She complains of diarrhea today but notes this is been a long-term problem for her. She has also had persistent nausea.  O: Vital signs are stable and she is afebrile      Bilateral groin incisions are clean dry and intact without evidence of erythema.      Both feet are pink and warm with brisk capillary refill there are 1+ popliteal pulses bilaterally.  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 I am uncertain as to the etiology of her persistent nausea. She was constipated for several days after surgery I suspect that her diarrhea today is more related to turn off her bowel function as she is been taking less and less pain medication then to an infectious process. She will continue her physical therapy I suspect she will require rehabilitation post hospitalization. From a vascular standpoint of view she will not require further vascular surgeries

## 2014-09-02 NOTE — Care Management (Signed)
Patient has Medicare A/B as payer. Home health will be covered at 100% for skilled need- physical therapy.

## 2014-09-02 NOTE — Progress Notes (Signed)
PT Cancellation Note  Patient Details Name: Lori Roberts MRN: [id number] DOB: 30-Dec-1921   Cancelled Treatment:    Reason Eval/Treat Not Completed: Other (comment) Pt states not feeling well.  Increased pain at incision site causing nausea.  Adv if able to re-attempt at later time.   Simcha Speir 09/02/2014, 9:05 AM Min Tunnell, PTA

## 2014-09-03 LAB — CBC
HEMATOCRIT: 27.3 % — AB (ref 35.0–47.0)
Hemoglobin: 8.9 g/dL — ABNORMAL LOW (ref 12.0–16.0)
MCH: 27.6 pg (ref 26.0–34.0)
MCHC: 32.5 g/dL (ref 32.0–36.0)
MCV: 85 fL (ref 80.0–100.0)
Platelets: 152 10*3/uL (ref 150–440)
RBC: 3.21 MIL/uL — ABNORMAL LOW (ref 3.80–5.20)
RDW: 16.3 % — ABNORMAL HIGH (ref 11.5–14.5)
WBC: 9.5 10*3/uL (ref 3.6–11.0)

## 2014-09-03 LAB — PROTIME-INR
INR: 1.45
PROTHROMBIN TIME: 17.8 s — AB (ref 11.4–15.0)

## 2014-09-03 LAB — APTT

## 2014-09-03 LAB — HEPARIN LEVEL (UNFRACTIONATED): HEPARIN UNFRACTIONATED: 0.67 [IU]/mL (ref 0.30–0.70)

## 2014-09-03 MED ORDER — SOTALOL HCL 80 MG PO TABS
120.0000 mg | ORAL_TABLET | Freq: Two times a day (BID) | ORAL | Status: DC
  Administered 2014-09-03 – 2014-09-06 (×6): 120 mg via ORAL
  Filled 2014-09-03: qty 1
  Filled 2014-09-03 (×5): qty 2

## 2014-09-03 NOTE — Progress Notes (Signed)
ANTICOAGULATION CONSULT NOTE - Follow Up Consult  Pharmacy Consult for Heparin Indication: atrial fibrillation  Allergies  Allergen Reactions  . Wool Alcohol [Lanolin] Swelling  . Azor [Amlodipine-Olmesartan] Rash  . Clindamycin/Lincomycin Rash  . Iodine Rash  . Mebaral [Mephobarbital] Rash  . Penicillins Rash  . Sulfa Antibiotics Rash    Patient Measurements: Height: 5\' 5"  (165.1 cm) Weight: 152 lb 1.9 oz (69 kg) IBW/kg (Calculated) : 57 Heparin Dosing Weight: 69 kg  Vital Signs: Temp: 98.7 F (37.1 C) (06/15 0726) Temp Source: Oral (06/15 0726) BP: 127/59 mmHg (06/15 0726) Pulse Rate: 100 (06/15 0726)  Labs:  Recent Labs  09/01/14 0227 09/01/14 0437 09/02/14 0500 09/02/14 1621 09/03/14 0056 09/03/14 0525  HGB 8.4* 8.4* 9.0*  --   --  8.9*  HCT 25.8* 25.4* 26.4*  --   --  27.3*  PLT 116* 109* 123*  --   --  152  APTT 89*  --  46*  --   --  >160*  LABPROT 15.8*  --  15.2*  --   --  17.8*  INR 1.24  --  1.18  --   --  1.45  HEPARINUNFRC 0.35 0.33 <0.10* 0.49 0.67  --   CREATININE 1.43*  --   --   --   --   --     Estimated Creatinine Clearance: 25.5 mL/min (by C-G formula based on Cr of 1.43).   Medications:  Scheduled:  . amLODipine  5 mg Oral Daily  . atorvastatin  20 mg Oral Daily  . darifenacin  7.5 mg Oral Daily  . docusate sodium  100 mg Oral Daily  . dorzolamide-timolol  1 drop Both Eyes TID  . ferrous sulfate  325 mg Oral BID WC  . hydrALAZINE  25 mg Oral Daily  . irbesartan  37.5 mg Oral Daily  . isosorbide dinitrate  30 mg Oral TID  . latanoprost  1 drop Both Eyes QHS  . pantoprazole  40 mg Oral Daily  . polyethylene glycol  17 g Oral Daily  . sotalol  80 mg Oral BID  . warfarin  7.5 mg Oral q1800  . Warfarin - Pharmacist Dosing Inpatient   Does not apply q1800   Infusions:  . heparin 950 Units/hr (09/03/14 0924)   PRN: acetaminophen **OR** acetaminophen, alum & mag hydroxide-simeth, guaiFENesin-dextromethorphan, magnesium sulfate 1 -  4 g bolus IVPB, morphine injection, ondansetron, oxyCODONE, phenol, potassium chloride, senna-docusate  Assessment: Patient with a h/o afib on warfarin previously also on heparin per vascular surgery after repair of acute aortic occlusion. MD instructions NOT to bolus patient. Patient resumed warfarin 6/12.   Goal of Therapy:  Heparin level 0.3-0.7 units/ml Monitor platelets by anticoagulation protocol: Yes   Plan:  6/14: Per night nurse from last night, no line issues overnight, heparin running at 7 ml/hr.  Hgb/Plt improved/stable from yesterday.  6/14 AM anti-Xa <0.1. Heparin infusion at 700 units/hr.  Increase rate to 950 units/hr. No bolus as per previous notes. Recheck in 8 hours.  6/14:  Heparin level @ 16:30 = 0.49 Will draw confirmation HL on 6/15 @ 00:30.   Anti-Xa level ordered for 1630 today and CBC for tomorrow AM.   Lori Roberts, PharmD, BCPS Clinical Pharmacist  09/03/2014 9:36 AM     6/15 00:30 anti-Xa 0.67. Lori Roberts, PharmD   09/03/2014

## 2014-09-03 NOTE — Care Management (Signed)
Met with patient and her sister to discuss discharge planning. She plans to go to her sisters address: [place]. [place], [place] at discharge. She states she has a rolling walker available at home. Corene Cornea with [facility] notified of address change and that patient may discharge home today. Her PCP is Dr. Lavera Guise.

## 2014-09-03 NOTE — Progress Notes (Signed)
ANTICOAGULATION CONSULT NOTE - Follow up [place] for warfarin Indication: atrial fibrillation/aortic occlusion  Allergies  Allergen Reactions  . Wool Alcohol [Lanolin] Swelling  . Azor [Amlodipine-Olmesartan] Rash  . Clindamycin/Lincomycin Rash  . Iodine Rash  . Mebaral [Mephobarbital] Rash  . Penicillins Rash  . Sulfa Antibiotics Rash    Patient Measurements: Height: 5\' 5"  (165.1 cm) Weight: 152 lb 1.9 oz (69 kg) IBW/kg (Calculated) : 57   Vital Signs: Temp: 98.7 F (37.1 C) (06/15 0726) Temp Source: Oral (06/15 0726) BP: 127/59 mmHg (06/15 0726) Pulse Rate: 100 (06/15 0726)  Labs:  Recent Labs  09/01/14 0227 09/01/14 0437 09/02/14 0500 09/02/14 1621 09/03/14 0056 09/03/14 0525  HGB 8.4* 8.4* 9.0*  --   --  8.9*  HCT 25.8* 25.4* 26.4*  --   --  27.3*  PLT 116* 109* 123*  --   --  152  APTT 89*  --  46*  --   --  >160*  LABPROT 15.8*  --  15.2*  --   --  17.8*  INR 1.24  --  1.18  --   --  1.45  HEPARINUNFRC 0.35 0.33 <0.10* 0.49 0.67  --   CREATININE 1.43*  --   --   --   --   --     Estimated Creatinine Clearance: 25.5 mL/min (by C-G formula based on Cr of 1.43).   Medical History: Past Medical History  Diagnosis Date  . Heart trouble   . Arthritis   . HBP (high blood pressure)   . Atrial fibrillation   . PVD (peripheral vascular disease)   . Glaucoma   . Hyperlipidemia   . GERD (gastroesophageal reflux disease)     Medications:  Scheduled:  . amLODipine  5 mg Oral Daily  . atorvastatin  20 mg Oral Daily  . darifenacin  7.5 mg Oral Daily  . docusate sodium  100 mg Oral Daily  . dorzolamide-timolol  1 drop Both Eyes TID  . ferrous sulfate  325 mg Oral BID WC  . hydrALAZINE  25 mg Oral Daily  . irbesartan  37.5 mg Oral Daily  . isosorbide dinitrate  30 mg Oral TID  . latanoprost  1 drop Both Eyes QHS  . pantoprazole  40 mg Oral Daily  . polyethylene glycol  17 g Oral Daily  . sotalol  80 mg Oral BID  . warfarin  7.5 mg  Oral q1800  . Warfarin - Pharmacist Dosing Inpatient   Does not apply q1800    Assessment: Patient with atrial fibrillation, receiving warfarin chronically for condition. Patient is currently on heparin gtt. Previously had vascular surgery for acute aortic occlusion. Pt reports home dose to be warfarin 5 mg daily.   Pt counseled on warfarin on 6/13 - book and handout provided to pt.   6/10 INR 1.11 6/12 INR 1.27 - Warfarin 5 mg 6/13 INR 1.24 - Warfarin 5 mg 6/14 INR 1.18  Goal of Therapy:  INR 2-3 Monitor platelets by anticoagulation protocol: Yes   Plan:  MD increased warfarin dose to warfarin 7.5 mg PO on 6/14.  (ordered as daily). INR trending upward but still subtherapeutic. Will continue current orders of warfarin 7.5 mg PO q1800. INR ordered with am labs.   Larene Beach, PharmD Clinical Pharmacist 09/03/2014,9:10 AM

## 2014-09-03 NOTE — Progress Notes (Signed)
Spoke to Dr. Reece Levy about critical APTT >160, spoke to Pharmacy to review heparin dosage, no changes made by Pharmacy at this time, per [facility] in Pharmacy they will review lab results in the Am on 08-04-14 and make changes accordingly as needed, no active bleeding observed, Darlin Priestly RN

## 2014-09-03 NOTE — Progress Notes (Signed)
Patient ID: Lori Roberts, female   DOB: Oct 05, 1921, 81 y.o.   MRN: [id number] [facility] PROGRESS NOTE  PCP: Cletis Athens, MD  HPI/Subjective: Patient is nauseated, but no significant abdominal pain or diarrhea at present, not eating well and only 25% of offered meals . Is planning to go home with home health  Objective: Filed Vitals:   09/03/14 0726  BP: 127/59  Pulse: 100  Temp: 98.7 F (37.1 C)  Resp: 18    Intake/Output Summary (Last 24 hours) at 09/03/14 1059 Last data filed at 09/03/14 0800  Gross per 24 hour  Intake  844.1 ml  Output      0 ml  Net  844.1 ml   Filed Weights   08/29/14 1702 08/30/14 0200  Weight: 67.132 kg (148 lb) 69 kg (152 lb 1.9 oz)    ROS: Review of Systems  Constitutional: Negative for fever and chills.  Eyes: Negative for blurred vision.  Respiratory: Negative for cough and shortness of breath.   Cardiovascular: Negative for chest pain.  Gastrointestinal: Positive for nausea, abdominal pain and diarrhea. Negative for vomiting and constipation.  Genitourinary: Negative for dysuria.  Musculoskeletal: Negative for joint pain.  Neurological: Negative for dizziness and headaches.  Endo/Heme/Allergies: Bruises/bleeds easily.   Exam: Physical Exam  Constitutional: She appears well-developed and well-nourished.  HENT:  Head: Normocephalic and atraumatic.  Nose: No mucosal edema.  Mouth/Throat: No oropharyngeal exudate or posterior oropharyngeal edema.  Eyes: Conjunctivae, EOM and lids are normal. Pupils are equal, round, and reactive to light.  Neck: No JVD present. Carotid bruit is not present. No edema present. No thyroid mass and no thyromegaly present.  Cardiovascular: S1 normal and S2 normal.  Exam reveals no gallop.   Murmur heard.  Systolic murmur is present with a grade of 3/6  Pulses:      Dorsalis pedis pulses are 2+ on the right side, and 2+ on the left side.  Respiratory: No respiratory distress. She has no  wheezes. She has no rhonchi. She has no rales.  GI: Soft. Bowel sounds are normal. There is no tenderness.  Musculoskeletal:       Right ankle: She exhibits swelling.       Left ankle: She exhibits swelling.  Lymphadenopathy:    She has no cervical adenopathy.  Neurological: She is alert. No cranial nerve deficit.  Skin: Skin is warm. No rash noted. Nails show no clubbing.  Bilateral groin incision site clean and dry.  Psychiatric: She has a normal mood and affect.   Data Reviewed: Basic Metabolic Panel:  Recent Labs Lab 08/29/14 1843 08/30/14 0915 08/30/14 2246 08/31/14 0708 09/01/14 0227  NA 138 141 139 140 137  K 3.4* 3.7 4.0 3.8 3.6  CL 102 106 106 106 106  CO2 25 28 24 28 26   GLUCOSE 114* 134* 133* 130* 123*  BUN 30* 25* 29* 32* 28*  CREATININE 1.37* 1.18* 1.36* 1.44* 1.43*  CALCIUM 9.1 8.3* 8.4* 8.6* 8.5*  MG 1.8  --   --   --   --    Liver Function Tests:  Recent Labs Lab 08/29/14 1843 08/30/14 0915  AST 31 37  ALT 26 25  ALKPHOS 79 72  BILITOT 0.8 0.8  PROT 5.9* 5.6*  ALBUMIN 3.5 3.3*    CBC:  Recent Labs Lab 08/29/14 1843 08/30/14 0915  08/31/14 0708 09/01/14 0227 09/01/14 0437 09/02/14 0500 09/03/14 0525  WBC 6.6 10.0  < > 8.9 9.8 9.2 9.3 9.5  NEUTROABS 5.2  8.7*  --   --   --  7.2*  --   --   HGB 10.8* 10.5*  < > 8.3* 8.4* 8.4* 9.0* 8.9*  HCT 33.3* 32.4*  < > 25.6* 25.8* 25.4* 26.4* 27.3*  MCV 85.2 85.3  < > 85.4 85.4 85.9 83.7 85.0  PLT 132* 129*  < > 110* 116* 109* 123* 152  < > = values in this interval not displayed. Scheduled Meds: . amLODipine  5 mg Oral Daily  . atorvastatin  20 mg Oral Daily  . darifenacin  7.5 mg Oral Daily  . docusate sodium  100 mg Oral Daily  . dorzolamide-timolol  1 drop Both Eyes TID  . ferrous sulfate  325 mg Oral BID WC  . hydrALAZINE  25 mg Oral Daily  . irbesartan  37.5 mg Oral Daily  . isosorbide dinitrate  30 mg Oral TID  . latanoprost  1 drop Both Eyes QHS  . pantoprazole  40 mg Oral Daily  .  polyethylene glycol  17 g Oral Daily  . sotalol  80 mg Oral BID  . warfarin  7.5 mg Oral q1800  . Warfarin - Pharmacist Dosing Inpatient   Does not apply q1800   Continuous Infusions: . heparin 950 Units/hr (09/03/14 0924)    Assessment/Plan:  1. Chronic atrial fibrillation/ bradycardia- decreased sotalol dose to 80 mg twice a day day before yesterday, now tachycardic, needs to be advanced again. Currently on heparin drip.  Coumadin 7.5 mg today again watching patient's pro time INR closely as patient cannot be discharged from the hospital unless her INR is 2.0 and above 2. Recent GI bleed, postoperative anemia- continue to monitor closely. Patient is on Protonix empirically. Hemoglobin drifting down likely dilutional and postop anemia. Continue ferrous sulfate. Stable 3. Embolization and distal aortic occlusion- status post bilateral procedures by vascular surgery. Currently on heparin drip. Further management as per vascular surgery. Likely home with family when INR therapeutic on Coumadin. 4. Essential hypertension blood pressure currently stable on usual medications. 5. Hyperlipidemia unspecified on atorvastatin. 6. Glaucoma on specified continue latanoprost. 7. Borderline elevated troponin likely demand ischemia. 8. Hypokalemia -replaced 9. Abdominal pain thought to be due to constipation , now on Senokot and MiraLAX,  Better with pain medication. Patient did have bowel movements day before yesterday. Continue Protonix, following clinically 10. Thrombocytopenia- looking back at old labs looks chronic.  Code Status:     Code Status Orders        Start     Ordered   08/30/14 0217  Full code   Continuous     08/30/14 0216    Advance Directive Documentation        Most Recent Value   Type of Advance Directive  Living will   Pre-existing out of facility DNR order (yellow form or pink MOST form)     "MOST" Form in Place?       Disposition Plan: Home with home health with niece  once INR therapeutic.  Time spent: 25 minutes  [facility]

## 2014-09-03 NOTE — Progress Notes (Signed)
S: The patient notes her feet are still a bit numb but no longer are painful.  She has also had persistent nausea.  O: Vital signs are stable and she is afebrile      Bilateral groin incisions are clean dry and intact without evidence of erythema.      Both feet are pink and warm with brisk capillary refill there are 1+ popliteal pulses bilaterally.  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 She will continue her physical therapy I suspect she will require rehabilitation post hospitalization. From a vascular standpoint of view she will not require further vascular surgeries

## 2014-09-03 NOTE — Progress Notes (Signed)
Physical Therapy Treatment Patient Details Name: Lori Roberts MRN: [id number] DOB: 09-Jun-1921 Today's Date: 09/03/2014    History of Present Illness This patient is a 81 year old female who came to [facility] with weakness and numbness in BLE with out pedal pulse. She had a b/I thrombolectomy.    PT Comments    Progressive increase in gait distance; generally steady, but slow and guarded.  No overt LOB.  Will plan to assess stairs next session as tolerated (has six to access bed/bathroom of niece's home upon discharge)  Follow Up Recommendations  Home health PT     Equipment Recommendations  Rolling walker with 5" wheels    Recommendations for Other Services       Precautions / Restrictions Precautions Precautions: Fall Restrictions Weight Bearing Restrictions: No    Mobility  Bed Mobility Overal bed mobility: Modified Independent Bed Mobility: Supine to Sit           General bed mobility comments: use of bedrails and elevated HOB  Transfers Overall transfer level: Needs assistance Equipment used: Rolling walker (2 wheeled) Transfers: Sit to/from Stand Sit to Stand: Min guard         General transfer comment: cuing for hand placement  Ambulation/Gait Ambulation/Gait assistance: Min guard Ambulation Distance (Feet): 100 Feet Assistive device: Rolling walker (2 wheeled)       General Gait Details: slow, guarded gait performace; broad BOS with sustained bilat hip flexion throughout gait cycle.  Steady without overt LOB, but do recommend continued use of RW at this time. Additional gait distance limited by fatigue.   Stairs            Wheelchair Mobility    Modified Rankin (Stroke Patients Only)       Balance Overall balance assessment: Needs assistance Sitting-balance support: No upper extremity supported;Feet supported Sitting balance-Leahy Scale: Good     Standing balance support: Bilateral upper extremity supported Standing balance-Leahy  Scale: Fair                      Cognition                            Exercises Other Exercises Other Exercises: Standing LE therex, 1x10, AROM for muscular strength/endurance: heel raises, mini squats, marching, hip abduct/adduct/flex/ext.  Seated rest periods in between to manage fatigue/hip pain.   Other Exercises: Groin sites clean, dry and intact before and after session.    General Comments        Pertinent Vitals/Pain Pain Assessment: 0-10 Pain Score: 5  Pain Location: L hip Pain Descriptors / Indicators: Sore Pain Intervention(s): Limited activity within patient's tolerance;Monitored during session;Repositioned    Home Living                      Prior Function            PT Goals (current goals can now be found in the care plan section) Acute Rehab PT Goals Patient Stated Goal: To go to niece's house for a few days before returning to apparment. PT Goal Formulation: With patient/family Time For Goal Achievement: 09/13/14 Potential to Achieve Goals: Good Progress towards PT goals: Progressing toward goals    Frequency  Min 2X/week    PT Plan Current plan remains appropriate    Co-evaluation             End of Session Equipment Utilized During Treatment:  Gait belt Activity Tolerance: Patient tolerated treatment well;Patient limited by fatigue Patient left: in chair;with call bell/phone within reach;with chair alarm set     Time: 0347-4259 PT Time Calculation (min) (ACUTE ONLY): 30 min  Charges:  $Gait Training: 8-22 mins $Therapeutic Exercise: 8-22 mins                    G Codes:      Gabriella Woodhead H. Owens Shark, PT, DPT 09/03/2014, 1:57 PM [phone]

## 2014-09-04 LAB — CBC
HEMATOCRIT: 27.6 % — AB (ref 35.0–47.0)
HEMOGLOBIN: 8.9 g/dL — AB (ref 12.0–16.0)
MCH: 27.3 pg (ref 26.0–34.0)
MCHC: 32.4 g/dL (ref 32.0–36.0)
MCV: 84.4 fL (ref 80.0–100.0)
Platelets: 182 10*3/uL (ref 150–440)
RBC: 3.28 MIL/uL — ABNORMAL LOW (ref 3.80–5.20)
RDW: 16.2 % — ABNORMAL HIGH (ref 11.5–14.5)
WBC: 8.7 10*3/uL (ref 3.6–11.0)

## 2014-09-04 LAB — PROTIME-INR
INR: 1.61
PROTHROMBIN TIME: 19.3 s — AB (ref 11.4–15.0)

## 2014-09-04 LAB — APTT

## 2014-09-04 LAB — HEPARIN LEVEL (UNFRACTIONATED): Heparin Unfractionated: 0.63 IU/mL (ref 0.30–0.70)

## 2014-09-04 MED ORDER — WARFARIN SODIUM 3 MG PO TABS
10.0000 mg | ORAL_TABLET | Freq: Every day | ORAL | Status: DC
  Administered 2014-09-04: 10 mg via ORAL
  Filled 2014-09-04: qty 2

## 2014-09-04 MED ORDER — ENSURE ENLIVE PO LIQD
237.0000 mL | Freq: Three times a day (TID) | ORAL | Status: DC
  Administered 2014-09-05 – 2014-09-06 (×4): 237 mL via ORAL

## 2014-09-04 NOTE — Progress Notes (Signed)
Patient ID: Lori Roberts, female   DOB: 12/13/1921, 81 y.o.   MRN: [id number] [facility] PROGRESS NOTE  PCP: Cletis Athens, MD  HPI/Subjective: Patient is having loose stool today, walked approximately 6 steps today,  still have some numbness and tingling sensation in legs, which she tells me has been feeling for the past few months. When the level is 1.6, being continued on heparin IV,  oral intake is satisfactory per patient  Objective: Filed Vitals:   09/04/14 0801  BP: 166/96  Pulse: 74  Temp: 97.9 F (36.6 C)  Resp:     Intake/Output Summary (Last 24 hours) at 09/04/14 1208 Last data filed at 09/04/14 0830  Gross per 24 hour  Intake    283 ml  Output      0 ml  Net    283 ml   Filed Weights   08/29/14 1702 08/30/14 0200  Weight: 67.132 kg (148 lb) 69 kg (152 lb 1.9 oz)    ROS: Review of Systems  Constitutional: Negative for fever and chills.  Eyes: Negative for blurred vision.  Respiratory: Negative for cough and shortness of breath.   Cardiovascular: Negative for chest pain.  Gastrointestinal: Positive for nausea, abdominal pain and diarrhea. Negative for vomiting and constipation.  Genitourinary: Negative for dysuria.  Musculoskeletal: Negative for joint pain.  Neurological: Negative for dizziness and headaches.  Endo/Heme/Allergies: Bruises/bleeds easily.   Exam: Physical Exam  Constitutional: She appears well-developed and well-nourished.  HENT:  Head: Normocephalic and atraumatic.  Nose: No mucosal edema.  Mouth/Throat: No oropharyngeal exudate or posterior oropharyngeal edema.  Eyes: Conjunctivae, EOM and lids are normal. Pupils are equal, round, and reactive to light.  Neck: No JVD present. Carotid bruit is not present. No edema present. No thyroid mass and no thyromegaly present.  Cardiovascular: S1 normal and S2 normal.  Exam reveals no gallop.   Murmur heard.  Systolic murmur is present with a grade of 3/6  Pulses:      Dorsalis  pedis pulses are 2+ on the right side, and 2+ on the left side.  Respiratory: No respiratory distress. She has no wheezes. She has no rhonchi. She has no rales.  GI: Soft. Bowel sounds are normal. There is no tenderness.  Musculoskeletal:       Right ankle: She exhibits swelling.       Left ankle: She exhibits swelling.  Lymphadenopathy:    She has no cervical adenopathy.  Neurological: She is alert. No cranial nerve deficit.  Skin: Skin is warm. No rash noted. Nails show no clubbing.  Bilateral groin incision site clean and dry.  Psychiatric: She has a normal mood and affect.   Diminished dorsalis pulses bilaterally but feet are warm Data Reviewed: Basic Metabolic Panel:  Recent Labs Lab 08/29/14 1843 08/30/14 0915 08/30/14 2246 08/31/14 0708 09/01/14 0227  NA 138 141 139 140 137  K 3.4* 3.7 4.0 3.8 3.6  CL 102 106 106 106 106  CO2 25 28 24 28 26   GLUCOSE 114* 134* 133* 130* 123*  BUN 30* 25* 29* 32* 28*  CREATININE 1.37* 1.18* 1.36* 1.44* 1.43*  CALCIUM 9.1 8.3* 8.4* 8.6* 8.5*  MG 1.8  --   --   --   --    Liver Function Tests:  Recent Labs Lab 08/29/14 1843 08/30/14 0915  AST 31 37  ALT 26 25  ALKPHOS 79 72  BILITOT 0.8 0.8  PROT 5.9* 5.6*  ALBUMIN 3.5 3.3*    CBC:  Recent  Labs Lab 08/29/14 1843 08/30/14 0915  09/01/14 0227 09/01/14 0437 09/02/14 0500 09/03/14 0525 09/04/14 0542  WBC 6.6 10.0  < > 9.8 9.2 9.3 9.5 8.7  NEUTROABS 5.2 8.7*  --   --  7.2*  --   --   --   HGB 10.8* 10.5*  < > 8.4* 8.4* 9.0* 8.9* 8.9*  HCT 33.3* 32.4*  < > 25.8* 25.4* 26.4* 27.3* 27.6*  MCV 85.2 85.3  < > 85.4 85.9 83.7 85.0 84.4  PLT 132* 129*  < > 116* 109* 123* 152 182  < > = values in this interval not displayed. Scheduled Meds: . amLODipine  5 mg Oral Daily  . atorvastatin  20 mg Oral Daily  . darifenacin  7.5 mg Oral Daily  . docusate sodium  100 mg Oral Daily  . dorzolamide-timolol  1 drop Both Eyes TID  . ferrous sulfate  325 mg Oral BID WC  . hydrALAZINE   25 mg Oral Daily  . irbesartan  37.5 mg Oral Daily  . isosorbide dinitrate  30 mg Oral TID  . latanoprost  1 drop Both Eyes QHS  . pantoprazole  40 mg Oral Daily  . polyethylene glycol  17 g Oral Daily  . sotalol  120 mg Oral BID  . warfarin  7.5 mg Oral q1800  . Warfarin - Pharmacist Dosing Inpatient   Does not apply q1800   Continuous Infusions: . heparin 950 Units/hr (09/03/14 1549)    Assessment/Plan:  1. Chronic atrial fibrillation/ bradycardia- decreased sotalol dose to 80 mg twice a day day , few days ago , then became tachycardic, no advanced again to home doses. Currently on heparin drip.  Coumadin 7.5 mg today again watching patient's pro time INR closely as patient cannot be discharged from the hospital unless her INR is 2.0 and above 2. Recent GI bleed, postoperative anemia- continue to monitor closely. Patient is on Protonix empirically. Hemoglobin is stable.  Continue ferrous sulfate.  3. Embolization and distal aortic occlusion- status post bilateral procedures by vascular surgery. Currently on heparin drip. Further management as per vascular surgery. Likely home with family when INR therapeutic on Coumadin. 4. Essential hypertension blood pressure currently stable on usual medications. 5. Hyperlipidemia unspecified on atorvastatin. 6. Glaucoma on specified continue latanoprost. 7. Borderline elevated troponin likely demand ischemia. 8. Hypokalemia -replaced 9. Abdominal pain , now diarrheal stool, follow closely and get C. difficile cultures if needed.  Continue Protonix, following clinically 10. Thrombocytopenia- looking back at old labs looks chronic.  Code Status:     Code Status Orders        Start     Ordered   08/30/14 0217  Full code   Continuous     08/30/14 0216    Advance Directive Documentation        Most Recent Value   Type of Advance Directive  Living will   Pre-existing out of facility DNR order (yellow form or pink MOST form)     "MOST" Form  in Place?       Disposition Plan: Home with home health with niece once INR therapeutic.  Time spent: 35 minutes  [facility] Hospitalists

## 2014-09-04 NOTE — Progress Notes (Signed)
Initial Nutrition Assessment  DOCUMENTATION CODES:     INTERVENTION:   (Nutrition Supplement Therapy) Recommend adding Ensure Enlive po BID, each supplement provides 350 kcal and 20 grams of protein  Meals and snacks: Cater to pt prefences   NUTRITION DIAGNOSIS:  Inadequate oral intake related to acute illness as evidenced by meal completion < 50%.    GOAL:  Patient will meet greater than or equal to 90% of their needs    MONITOR:   (Energy intake, Nutrition Supplement Therapy)  REASON FOR ASSESSMENT:  Other (Comment) (length of stay)    ASSESSMENT:  Pt admitted with afib, GI bleed  PMHx:  Past Medical History  Diagnosis Date  . Heart trouble   . Arthritis   . HBP (high blood pressure)   . Atrial fibrillation   . PVD (peripheral vascular disease)   . Glaucoma   . Hyperlipidemia   . GERD (gastroesophageal reflux disease)     Current Nutrition: eating on average 46% of meals per I and O sheet. Pt reports poor po intake    Food/Nutrition-Related History: Pt reports poor po intake prior to admission for the last 2 weeks secondary to diarrhea and not feeling well   Labs: Electrolyte and Renal Profile:  Recent Labs Lab 08/29/14 1843  08/30/14 2246 08/31/14 0708 09/01/14 0227  BUN 30*  < > 29* 32* 28*  CREATININE 1.37*  < > 1.36* 1.44* 1.43*  NA 138  < > 139 140 137  K 3.4*  < > 4.0 3.8 3.6  MG 1.8  --   --   --   --   < > = values in this interval not displayed.    Medications: colace, KCL mag sulfate   Digestive system: + BM   Physical Findings: Nutrition-Focused physical exam completed. Findings are WDL for fat depletion, muscle depletion, and edema.     Weight Change: 4% weight loss in the last over 1 year   Height:  Ht Readings from Last 1 Encounters:  08/30/14 5\' 5"  (1.651 m)    Weight:  Wt Readings from Last 1 Encounters:  08/30/14 152 lb 1.9 oz (69 kg)     Wt Readings from Last 10 Encounters:  08/30/14 152 lb 1.9 oz  (69 kg)  08/17/14 152 lb (68.947 kg)  04/24/13 158 lb (71.668 kg)  01/23/13 158 lb (71.668 kg)    BMI:  Body mass index is 25.31 kg/(m^2).  Estimated Nutritional Needs:  Kcal:  BEE 1110 kcals (IF 1.0-1.2, AF 1.3) 1443-1731 kcals/d  Protein:  (1.0-1.2 g/d) 69-83 g/d  Fluid:  (25-64ml/kg) 1725-2058ml/d   Diet Order:  Diet regular Room service appropriate?: Yes; Fluid consistency:: Thin  EDUCATION NEEDS:  No education needs identified at this time   Intake/Output Summary (Last 24 hours) at 09/04/14 1536 Last data filed at 09/04/14 1130  Gross per 24 hour  Intake    523 ml  Output      0 ml  Net    523 ml     MODERATE Care Level Ronie Fleeger B. Zenia Resides, [place], [place] (pager)

## 2014-09-04 NOTE — Progress Notes (Signed)
Physical Therapy Treatment Patient Details Name: Lori Roberts MRN: [id number] DOB: [birth year] Today's Date: 09/04/2014    History of Present Illness This patient is a 81 year old female who came to [facility] with weakness and numbness in BLE with out pedal pulse. She had a b/I thrombolectomy.    PT Comments    Patient continues to make gradual improvement in gait distance and overall activity tolerance.  Able to initiate stair training this date; requires single rail, HHA/min assist +1 to safely complete.  Slow and deliberate with all functional mobility/activities, but no overt LOB or significant safety concern.   Follow Up Recommendations  Home health PT     Equipment Recommendations  Rolling walker with 5" wheels    Recommendations for Other Services       Precautions / Restrictions Precautions Precautions: Fall Restrictions Weight Bearing Restrictions: No    Mobility  Bed Mobility Overal bed mobility: Modified Independent Bed Mobility: Supine to Sit           General bed mobility comments: increased time to complete  Transfers Overall transfer level: Needs assistance Equipment used: Rolling walker (2 wheeled) Transfers: Sit to/from Stand Sit to Stand: Supervision;Min guard         General transfer comment: cuing for hand placement, but fair carry-over noted within session  Ambulation/Gait Ambulation/Gait assistance: Min guard Ambulation Distance (Feet): 150 Feet Assistive device: Rolling walker (2 wheeled)     Gait velocity interpretation: <1.8 ft/sec, indicative of risk for recurrent falls General Gait Details: improving postural extension, but continues to demonstrate slow, guarded gait pattern.  Reciprocal stepping pattern, but with decreased overall step height/length.  Able to negotiate turns/obstacles without LOB of significant safety concern.   Stairs Stairs: Yes Stairs assistance: Min assist Stair Management: One rail Right (with contralateral  HHA) Number of Stairs: 3 General stair comments: step to gait pattern, min assist for dynamic balance; prefers unilateral rail and HHA vs. bilat UEs on single rail (has one rail to hold at neice's home)  Wheelchair Mobility    Modified Rankin (Stroke Patients Only)       Balance                                    Cognition                            Exercises Other Exercises Other Exercises: Sit/stand with RW from variety of surface heights (edge of bed, recliner, surface without armrests), cga/close sup--safety, indep of performance improves with repetition of task    General Comments        Pertinent Vitals/Pain Pain Assessment: No/denies pain    Home Living                      Prior Function            PT Goals (current goals can now be found in the care plan section) Acute Rehab PT Goals Patient Stated Goal: To go to niece's house for a few days before returning to apparment. PT Goal Formulation: With patient/family Time For Goal Achievement: 09/13/14 Potential to Achieve Goals: Good Progress towards PT goals: Progressing toward goals    Frequency  Min 2X/week    PT Plan Current plan remains appropriate    Co-evaluation  End of Session Equipment Utilized During Treatment: Gait belt Activity Tolerance: Patient tolerated treatment well;Patient limited by fatigue Patient left: in chair;with call bell/phone within reach;with chair alarm set     Time: 1829-9371 PT Time Calculation (min) (ACUTE ONLY): 23 min  Charges:  $Gait Training: 23-37 mins                    G Codes:      Myranda Pavone H. Owens Shark, PT, DPT 09/04/2014, 11:44 AM [phone]

## 2014-09-04 NOTE — Progress Notes (Addendum)
ANTICOAGULATION CONSULT NOTE - Follow up [place] for warfarin Indication: atrial fibrillation/aortic occlusion  Allergies  Allergen Reactions  . Wool Alcohol [Lanolin] Swelling  . Azor [Amlodipine-Olmesartan] Rash  . Clindamycin/Lincomycin Rash  . Iodine Rash  . Mebaral [Mephobarbital] Rash  . Penicillins Rash  . Sulfa Antibiotics Rash    Patient Measurements: Height: 5\' 5"  (165.1 cm) Weight: 152 lb 1.9 oz (69 kg) IBW/kg (Calculated) : 57   Vital Signs: Temp: 97.9 F (36.6 C) (06/16 0801) Temp Source: Oral (06/16 0801) BP: 166/96 mmHg (06/16 0801) Pulse Rate: 74 (06/16 0801)  Labs:  Recent Labs  09/02/14 0500 09/02/14 1621 09/03/14 0056 09/03/14 0525 09/04/14 0542  HGB 9.0*  --   --  8.9* 8.9*  HCT 26.4*  --   --  27.3* 27.6*  PLT 123*  --   --  152 182  APTT 46*  --   --  >160*  --   LABPROT 15.2*  --   --  17.8* 19.3*  INR 1.18  --   --  1.45 1.61  HEPARINUNFRC <0.10* 0.49 0.67  --  0.63    Estimated Creatinine Clearance: 25.5 mL/min (by C-G formula based on Cr of 1.43).   Medical History: Past Medical History  Diagnosis Date  . Heart trouble   . Arthritis   . HBP (high blood pressure)   . Atrial fibrillation   . PVD (peripheral vascular disease)   . Glaucoma   . Hyperlipidemia   . GERD (gastroesophageal reflux disease)     Medications:  Scheduled:  . amLODipine  5 mg Oral Daily  . atorvastatin  20 mg Oral Daily  . darifenacin  7.5 mg Oral Daily  . docusate sodium  100 mg Oral Daily  . dorzolamide-timolol  1 drop Both Eyes TID  . ferrous sulfate  325 mg Oral BID WC  . hydrALAZINE  25 mg Oral Daily  . irbesartan  37.5 mg Oral Daily  . isosorbide dinitrate  30 mg Oral TID  . latanoprost  1 drop Both Eyes QHS  . pantoprazole  40 mg Oral Daily  . polyethylene glycol  17 g Oral Daily  . sotalol  120 mg Oral BID  . warfarin  7.5 mg Oral q1800  . Warfarin - Pharmacist Dosing Inpatient   Does not apply q1800     Assessment: Patient with atrial fibrillation, receiving warfarin chronically for condition. Patient is currently on heparin gtt. Previously had vascular surgery for acute aortic occlusion. Pt reports home dose to be warfarin 5 mg daily.   Pt counseled on warfarin on 6/13 - book and handout provided to pt.   6/10 INR 1.11 6/12 INR 1.27 - Warfarin 5 mg 6/13 INR 1.24 - Warfarin 5 mg 6/14 INR 1.18- 7.5 mg 6/15: INR: 1.45 6/16: 1.61  Goal of Therapy:  INR 2-3 Monitor platelets by anticoagulation protocol: Yes   Plan:  MD increased warfarin dose to warfarin 7.5 mg PO on 6/14.  (ordered as daily). INR trending upward but still subtherapeutic. Will continue current orders of warfarin 7.5 mg PO q1800. INR ordered with am labs.   Larene Beach, PharmD Clinical Pharmacist 09/04/2014,8:29 AM    Addendum:  MD Ether Griffins would like to increase patient's dose to 10 mg daily. Would like to discharge patient once PT/INR > or = 2.0. Will change to 10 mg daily.

## 2014-09-04 NOTE — Progress Notes (Signed)
Occupational Therapy Discharge Patient Details Name: PANSEY PINHEIRO MRN: [id number] DOB: Dec 24, 1921 Today's Date: 09/04/2014 Time: 9450-3888 OT Time Calculation (min): 30 min  Patient discharged from OT services secondary to goals met and no further OT needs identified.  Pt able to complete LB dressing without any assistive devices when crossing one leg over the other sitting EOB with no LOB.  Reviewed recommendations to increase safety at home and in the bathroom to prevent falls.  Pt sponge bathed prior to surgery and plans to do sponge bathing at home again.  All goals met for OT and no further OT needed or recommended.  Please see latest therapy progress note for current level of functioning and progress toward goals.    Progress and discharge plan discussed with patient and/or caregiver: Patient/Caregiver agrees with plan  GO     Laurel Smeltz    Chrys Racer, OTR/L  09/04/2014, 4:16 PM

## 2014-09-05 LAB — CBC
HEMATOCRIT: 27 % — AB (ref 35.0–47.0)
Hemoglobin: 8.8 g/dL — ABNORMAL LOW (ref 12.0–16.0)
MCH: 27.6 pg (ref 26.0–34.0)
MCHC: 32.5 g/dL (ref 32.0–36.0)
MCV: 85 fL (ref 80.0–100.0)
Platelets: 182 10*3/uL (ref 150–440)
RBC: 3.18 MIL/uL — ABNORMAL LOW (ref 3.80–5.20)
RDW: 16.4 % — AB (ref 11.5–14.5)
WBC: 7.8 10*3/uL (ref 3.6–11.0)

## 2014-09-05 LAB — PROTIME-INR
INR: 2.25
PROTHROMBIN TIME: 25 s — AB (ref 11.4–15.0)

## 2014-09-05 LAB — C DIFFICILE QUICK SCREEN W PCR REFLEX
C Diff antigen: NEGATIVE
C Diff interpretation: NEGATIVE
C Diff toxin: NEGATIVE

## 2014-09-05 LAB — HEPARIN LEVEL (UNFRACTIONATED): Heparin Unfractionated: 0.49 IU/mL (ref 0.30–0.70)

## 2014-09-05 LAB — APTT: aPTT: >160 seconds — ABNORMAL HIGH (ref 24–37)

## 2014-09-05 MED ORDER — SODIUM CHLORIDE 0.9 % IV SOLN
INTRAVENOUS | Status: DC
  Administered 2014-09-05 – 2014-09-06 (×3): via INTRAVENOUS

## 2014-09-05 MED ORDER — WARFARIN SODIUM 3 MG PO TABS
5.0000 mg | ORAL_TABLET | Freq: Every day | ORAL | Status: DC
  Administered 2014-09-05: 5 mg via ORAL
  Filled 2014-09-05: qty 1

## 2014-09-05 NOTE — Progress Notes (Signed)
ANTICOAGULATION CONSULT NOTE - Follow up [place] for warfarin Indication: atrial fibrillation/aortic occlusion  Allergies  Allergen Reactions  . Wool Alcohol [Lanolin] Swelling  . Azor [Amlodipine-Olmesartan] Rash  . Clindamycin/Lincomycin Rash  . Iodine Rash  . Mebaral [Mephobarbital] Rash  . Penicillins Rash  . Sulfa Antibiotics Rash    Patient Measurements: Height: 5\' 5"  (165.1 cm) Weight: 152 lb 1.9 oz (69 kg) IBW/kg (Calculated) : 57   Vital Signs: Temp: 98.2 F (36.8 C) (06/17 0829) Temp Source: Oral (06/17 0829) BP: 144/73 mmHg (06/17 0829) Pulse Rate: 92 (06/17 0829)  Labs:  Recent Labs  09/03/14 0056  09/03/14 0525 09/04/14 0542 09/05/14 0449  HGB  --   < > 8.9* 8.9* 8.8*  HCT  --   --  27.3* 27.6* 27.0*  PLT  --   --  152 182 182  APTT  --   --  >160* >160* >160*  LABPROT  --   --  17.8* 19.3* 25.0*  INR  --   --  1.45 1.61 2.25  HEPARINUNFRC 0.67  --   --  0.63 0.49  < > = values in this interval not displayed.  Estimated Creatinine Clearance: 25.5 mL/min (by C-G formula based on Cr of 1.43).   Medical History: Past Medical History  Diagnosis Date  . Heart trouble   . Arthritis   . HBP (high blood pressure)   . Atrial fibrillation   . PVD (peripheral vascular disease)   . Glaucoma   . Hyperlipidemia   . GERD (gastroesophageal reflux disease)     Medications:  Scheduled:  . amLODipine  5 mg Oral Daily  . atorvastatin  20 mg Oral Daily  . darifenacin  7.5 mg Oral Daily  . docusate sodium  100 mg Oral Daily  . dorzolamide-timolol  1 drop Both Eyes TID  . feeding supplement (ENSURE ENLIVE)  237 mL Oral TID BM  . ferrous sulfate  325 mg Oral BID WC  . hydrALAZINE  25 mg Oral Daily  . irbesartan  37.5 mg Oral Daily  . isosorbide dinitrate  30 mg Oral TID  . latanoprost  1 drop Both Eyes QHS  . pantoprazole  40 mg Oral Daily  . sotalol  120 mg Oral BID  . warfarin  10 mg Oral q1800  . Warfarin - Pharmacist Dosing  Inpatient   Does not apply q1800    Assessment: Patient with atrial fibrillation, receiving warfarin chronically for condition. Patient is currently on heparin gtt. Previously had vascular surgery for acute aortic occlusion. Pt reports home dose to be warfarin 5 mg daily.   Pt counseled on warfarin on 6/13 - book and handout provided to pt.   6/10 INR 1.11 6/12 INR 1.27 - Warfarin 5 mg 6/13 INR 1.24 - Warfarin 5 mg 6/14 INR 1.18- 7.5 mg 6/15: INR: 1.45 6/16: 1.61- 10 mg 6/17:  2.25  Goal of Therapy:  INR 2-3 Monitor platelets by anticoagulation protocol: Yes   Plan:  INR increasing significantly to 2.25. Will change to home dose of warfarin 5 mg PO daily.

## 2014-09-05 NOTE — Progress Notes (Signed)
PT Cancellation Note  Patient Details Name: Lori Roberts MRN: [id number] DOB: 29-Jul-1921   Cancelled Treatment:    Reason Eval/Treat Not Completed: Patient declined, no reason specified (Reports "my bowels have been running all morning.  I just can't take exercise right now".  Offered bed-level activity only; patient continued to politely decline.  Will re-attempt at later time/date.)   Prue Lingenfelter H. Owens Shark, PT, DPT 09/05/2014, 9:47 AM [phone]

## 2014-09-05 NOTE — Progress Notes (Signed)
Patient ID: Lori Roberts, female   DOB: 20-Sep-1921, 81 y.o.   MRN: [id number] [facility] PROGRESS NOTE  PCP: Cletis Athens, MD  HPI/Subjective: Patient is having loose stools again today, possibly was given Colace as well as MiraLAX over the past few days. No fevers. No hematochezia. Overall feels comfortable, although is noted to have dry mucosa. Oral intake is poor. Unclear if she is able to drink fluids. Coumadin level is therapeutic with INR of 2.2  Objective: Filed Vitals:   09/05/14 0829  BP: 144/73  Pulse: 92  Temp: 98.2 F (36.8 C)  Resp: 18    Intake/Output Summary (Last 24 hours) at 09/05/14 1312 Last data filed at 09/05/14 0900  Gross per 24 hour  Intake    132 ml  Output    250 ml  Net   -118 ml   Filed Weights   08/29/14 1702 08/30/14 0200  Weight: 67.132 kg (148 lb) 69 kg (152 lb 1.9 oz)    ROS: Review of Systems  Constitutional: Negative for fever and chills.  Eyes: Negative for blurred vision.  Respiratory: Negative for cough and shortness of breath.   Cardiovascular: Negative for chest pain.  Gastrointestinal: Positive for nausea, abdominal pain and diarrhea. Negative for vomiting and constipation.  Genitourinary: Negative for dysuria.  Musculoskeletal: Negative for joint pain.  Neurological: Negative for dizziness and headaches.  Endo/Heme/Allergies: Bruises/bleeds easily.   Exam: Physical Exam  Constitutional: She appears well-developed and well-nourished.  HENT:  Head: Normocephalic and atraumatic.  Nose: No mucosal edema.  Mouth/Throat: No oropharyngeal exudate or posterior oropharyngeal edema.  Eyes: Conjunctivae, EOM and lids are normal. Pupils are equal, round, and reactive to light.  Neck: No JVD present. Carotid bruit is not present. No edema present. No thyroid mass and no thyromegaly present.  Cardiovascular: S1 normal and S2 normal.  Exam reveals no gallop.   Murmur heard.  Systolic murmur is present with a grade of 3/6   Pulses:      Dorsalis pedis pulses are 2+ on the right side, and 2+ on the left side.  Respiratory: No respiratory distress. She has no wheezes. She has no rhonchi. She has no rales.  GI: Soft. Bowel sounds are normal. There is no tenderness.  Musculoskeletal:       Right ankle: She exhibits swelling.       Left ankle: She exhibits swelling.  Lymphadenopathy:    She has no cervical adenopathy.  Neurological: She is alert. No cranial nerve deficit.  Skin: Skin is warm. No rash noted. Nails show no clubbing.  Bilateral groin incision site clean and dry.  Psychiatric: She has a normal mood and affect.   Diminished dorsalis pulses bilaterally but feet are warm Data Reviewed: Basic Metabolic Panel:  Recent Labs Lab 08/29/14 1843 08/30/14 0915 08/30/14 2246 08/31/14 0708 09/01/14 0227  NA 138 141 139 140 137  K 3.4* 3.7 4.0 3.8 3.6  CL 102 106 106 106 106  CO2 25 28 24 28 26   GLUCOSE 114* 134* 133* 130* 123*  BUN 30* 25* 29* 32* 28*  CREATININE 1.37* 1.18* 1.36* 1.44* 1.43*  CALCIUM 9.1 8.3* 8.4* 8.6* 8.5*  MG 1.8  --   --   --   --    Liver Function Tests:  Recent Labs Lab 08/29/14 1843 08/30/14 0915  AST 31 37  ALT 26 25  ALKPHOS 79 72  BILITOT 0.8 0.8  PROT 5.9* 5.6*  ALBUMIN 3.5 3.3*    CBC:  Recent Labs Lab 08/29/14 1843 08/30/14 0915  09/01/14 0437 09/02/14 0500 09/03/14 0525 09/04/14 0542 09/05/14 0449  WBC 6.6 10.0  < > 9.2 9.3 9.5 8.7 7.8  NEUTROABS 5.2 8.7*  --  7.2*  --   --   --   --   HGB 10.8* 10.5*  < > 8.4* 9.0* 8.9* 8.9* 8.8*  HCT 33.3* 32.4*  < > 25.4* 26.4* 27.3* 27.6* 27.0*  MCV 85.2 85.3  < > 85.9 83.7 85.0 84.4 85.0  PLT 132* 129*  < > 109* 123* 152 182 182  < > = values in this interval not displayed. Scheduled Meds: . amLODipine  5 mg Oral Daily  . atorvastatin  20 mg Oral Daily  . darifenacin  7.5 mg Oral Daily  . docusate sodium  100 mg Oral Daily  . dorzolamide-timolol  1 drop Both Eyes TID  . feeding supplement (ENSURE  ENLIVE)  237 mL Oral TID BM  . ferrous sulfate  325 mg Oral BID WC  . hydrALAZINE  25 mg Oral Daily  . irbesartan  37.5 mg Oral Daily  . isosorbide dinitrate  30 mg Oral TID  . latanoprost  1 drop Both Eyes QHS  . pantoprazole  40 mg Oral Daily  . sotalol  120 mg Oral BID  . warfarin  5 mg Oral q1800  . Warfarin - Pharmacist Dosing Inpatient   Does not apply q1800   Continuous Infusions: . heparin 950 Units/hr (09/04/14 2315)    Assessment/Plan:  1. Chronic atrial fibrillation/ bradycardia- decreased sotalol dose to 80 mg twice daily few days ago , then became tachycardic, no advanced again to home doses. INR is therapeutic, discontinue  heparin drip.  Needs to have close follow-up on INR as outpatient  2. Recent GI bleed, postoperative anemia- continue to monitor closely. Patient is on Protonix empirically. Hemoglobin is stable.  Continue ferrous sulfate.  3. Embolization and distal aortic occlusion- status post bilateral procedures by vascular surgery. Discontinue heparin drip. Further management as per vascular surgery. Likely home with family when INR therapeutic on Coumadin. 4. Essential hypertension blood pressure currently stable on usual medications. 5. Hyperlipidemia unspecified on atorvastatin. 6. Glaucoma on specified continue latanoprost. 7. Borderline elevated troponin likely demand ischemia. 8. Hypokalemia -replaced 9. Diarrhea, possibly related to Colace as well as MiraLAX was which was given in the past. Get C. difficile cultures repeated.  Continue Protonix, following clinically 10. Thrombocytopenia- looking back at old labs looks chronic. 11. Dehydration. Follow patient closely in regards to oral intake and consider getting her on IV fluids if her oral intake does not improve.   Code Status:     Code Status Orders        Start     Ordered   08/30/14 0217  Full code   Continuous     08/30/14 0216    [facility] Directive Documentation        Most Recent Value    Type of [facility] Directive  Living will   Pre-existing out of facility DNR order (yellow form or pink MOST form)     "MOST" Form in Place?       Disposition Plan: Home with home health with niece once INR therapeutic.  Time spent: 35 minutes  [facility]

## 2014-09-05 NOTE — Progress Notes (Signed)
S: The patient notes her feet are still a bit numb but no longer are painful. She has also had persistent nausea which is better but her diarrhea has continued.  O: Vital signs are stable and she is afebrile  Bilateral groin incisions are clean dry and intact without evidence of erythema.  Both feet are pink and warm with brisk capillary refill there are 1+ popliteal pulses bilaterally.  A: Embolization with occlusion of the distal aorta status post embolectomy and revascularization  P: She has done well from her surgery especially considering her age. She will continue her physical therapy I suspect she will require rehabilitation post hospitalization. From a vascular standpoint of view she will not require further vascular surgeries per medicine's assessment she has continued diarrhea which requires further treatment and is dehydrated and therefore will require IV fluids from a surgical point of view she is ready for discharge as noted above

## 2014-09-05 NOTE — Care Management (Addendum)
Patient remains on heparin drip.  Patient says that when she is discharged- she plans to go the the home of her niece Tedd Sias  Phone [phone].  Does not know the exact house number but is on [address].  [place] is [employer].  Updated referral to [facility]

## 2014-09-05 NOTE — Progress Notes (Signed)
ANTICOAGULATION CONSULT NOTE - Follow Up Consult  Pharmacy Consult for Heparin Indication: atrial fibrillation  Allergies  Allergen Reactions  . Wool Alcohol [Lanolin] Swelling  . Azor [Amlodipine-Olmesartan] Rash  . Clindamycin/Lincomycin Rash  . Iodine Rash  . Mebaral [Mephobarbital] Rash  . Penicillins Rash  . Sulfa Antibiotics Rash    Patient Measurements: Height: 5\' 5"  (165.1 cm) Weight: 152 lb 1.9 oz (69 kg) IBW/kg (Calculated) : 57 Heparin Dosing Weight: 69 kg  Vital Signs: Temp: 98.2 F (36.8 C) (06/17 0829) Temp Source: Oral (06/17 0829) BP: 144/73 mmHg (06/17 0829) Pulse Rate: 92 (06/17 0829)  Labs:  Recent Labs  09/03/14 0056  09/03/14 0525 09/04/14 0542 09/05/14 0449  HGB  --   < > 8.9* 8.9* 8.8*  HCT  --   --  27.3* 27.6* 27.0*  PLT  --   --  152 182 182  APTT  --   --  >160* >160* >160*  LABPROT  --   --  17.8* 19.3* 25.0*  INR  --   --  1.45 1.61 2.25  HEPARINUNFRC 0.67  --   --  0.63 0.49  < > = values in this interval not displayed.  Estimated Creatinine Clearance: 25.5 mL/min (by C-G formula based on Cr of 1.43).   Medications:  Scheduled:  . amLODipine  5 mg Oral Daily  . atorvastatin  20 mg Oral Daily  . darifenacin  7.5 mg Oral Daily  . docusate sodium  100 mg Oral Daily  . dorzolamide-timolol  1 drop Both Eyes TID  . feeding supplement (ENSURE ENLIVE)  237 mL Oral TID BM  . ferrous sulfate  325 mg Oral BID WC  . hydrALAZINE  25 mg Oral Daily  . irbesartan  37.5 mg Oral Daily  . isosorbide dinitrate  30 mg Oral TID  . latanoprost  1 drop Both Eyes QHS  . pantoprazole  40 mg Oral Daily  . sotalol  120 mg Oral BID  . warfarin  10 mg Oral q1800  . Warfarin - Pharmacist Dosing Inpatient   Does not apply q1800   Infusions:  . heparin 950 Units/hr (09/04/14 2315)   PRN: acetaminophen **OR** acetaminophen, alum & mag hydroxide-simeth, guaiFENesin-dextromethorphan, magnesium sulfate 1 - 4 g bolus IVPB, morphine injection, ondansetron,  oxyCODONE, phenol  Assessment: Patient with a h/o afib on warfarin previously also on heparin per vascular surgery after repair of acute aortic occlusion. MD instructions NOT to bolus patient. Patient resumed warfarin 6/12.   Goal of Therapy:  Heparin level 0.3-0.7 units/ml Monitor platelets by anticoagulation protocol: Yes   Plan:   Will continue Heparin 950 units/hr. Anti Xa level was 0.49 on 6/17 am labs. Labs ordered to be drawn with am labs on 6/18

## 2014-09-06 LAB — CBC
HEMATOCRIT: 27.2 % — AB (ref 35.0–47.0)
Hemoglobin: 8.8 g/dL — ABNORMAL LOW (ref 12.0–16.0)
MCH: 27.7 pg (ref 26.0–34.0)
MCHC: 32.5 g/dL (ref 32.0–36.0)
MCV: 85.2 fL (ref 80.0–100.0)
Platelets: 175 10*3/uL (ref 150–440)
RBC: 3.19 MIL/uL — ABNORMAL LOW (ref 3.80–5.20)
RDW: 16.3 % — ABNORMAL HIGH (ref 11.5–14.5)
WBC: 7 10*3/uL (ref 3.6–11.0)

## 2014-09-06 LAB — PROTIME-INR
INR: 2.75
PROTHROMBIN TIME: 29.2 s — AB (ref 11.4–15.0)

## 2014-09-06 MED ORDER — FERROUS SULFATE 325 (65 FE) MG PO TABS: 325.0000 mg | ORAL_TABLET | Freq: Two times a day (BID) | ORAL | Status: AC

## 2014-09-06 MED ORDER — ENSURE ENLIVE PO LIQD: 237.0000 mL | Freq: Three times a day (TID) | ORAL | Status: AC

## 2014-09-06 MED ORDER — WARFARIN SODIUM 5 MG PO TABS: ORAL_TABLET | ORAL | Status: DC

## 2014-09-06 MED ORDER — ACETAMINOPHEN 325 MG PO TABS: 325.0000 mg | ORAL_TABLET | ORAL | Status: DC | PRN

## 2014-09-06 NOTE — Care Management Note (Signed)
Case Management Note  Patient Details  Name: ASENETH HACK MRN: [id number] Date of Birth: 1921-07-28  Subjective/Objective:   Discharge information faxed and called to [facility] for PT, RN, Aid. On 09/01/14 this Probation officer provided [facility] with temporary address for Mrs Saad after discharge. Updated faxed facesheet today for [facility] per temporary address change.                   Action/Plan:   Expected Discharge Date:                  Expected Discharge Plan:  [place]  In-House Referral:  NA  Discharge planning Services     Post Acute Care Choice:  NA Choice offered to:  Patient  DME Arranged:  N/A DME Agency:  [place]:    [place] Agency:  [place]  Status of Service:  In process, will continue to follow  Medicare Important Message Given:  Yes Date Medicare IM Given:  09/01/14 Medicare IM give by:  LR Case manager Date Additional Medicare IM Given:    Additional Medicare Important Message give by:     If discussed at [place] of Stay Meetings, dates discussed:    Additional Comments:  Jamil Armwood A, RN 09/06/2014, 3:36 PM

## 2014-09-06 NOTE — Discharge Summary (Signed)
[facility]    Discharge Summary    Patient ID:  TAQUILLA DOWNUM MRN: [id number] DOB/AGE: 1921/08/17 81 y.o.  Admit date: 08/29/2014 Discharge date: 09/06/2014 Date of Surgery: 08/29/2014 - 08/30/2014 Surgeon: Juliann Mule): Serafina Mitchell, MD  Admission Diagnosis: Back pain [M54.9] Aortic occlusion [Q25.3]  Discharge Diagnoses:  Back pain [M54.9] Aortic occlusion [Q25.3]  Secondary Diagnoses: Past Medical History  Diagnosis Date   Heart trouble    Arthritis    HBP (high blood pressure)    Atrial fibrillation    PVD (peripheral vascular disease)    Glaucoma    Hyperlipidemia    GERD (gastroesophageal reflux disease)    HPI:  The patient presented to the emergency department with a acute onset of back pain as well as leg weakness. CT angiogram revealed acute aortic occlusion. Clinically, the patient had minimal sensation in either lower extremity and decreased movement, right greater than left.  Hospital Course:  LYNNELLE MESMER is a 81 y.o. female is s/p  1) Bilateral aorta iliac thrombectomy 2) Thromboembolectomy 3) Bilateral femoral thromboembolectomy  Extubated: POD # 0   Intraoperatively acute thrombus was encountered in both groins. A large amount of clot was evacuated from the aorta. Unable to pass a Fogarty catheter down the right leg with onne getting hung up at 30cm. On the left, the Fogarty went all the way down.The patient had good Doppler signals in the anterior tibial posterior tibial artery after the procedure.  The patients hospital stay was uncomplicated. She did have a bout of diarrhea after laxatives were prescribed for constipation most likely narcotic induced. That has now resolved. She was followed by medicine who managed her anticoagulation, first with heparin gtt then transitioned coumadin. The patient is now therapeutic on coumadin. The patient had to be encouraged to increase her oral intake in house however  upon discharge her appetite was improved.   Physical exam:  A&Ox3, NAD CV: RRR Pulmonary: CTA Bilaterally Abdomen: Soft, Nontender, Nondistended Vascular: Right: Groin Incision: Dermabond intact, clean and dry, no signs of infection noted. No drainage noted. No hematoma. Thigh soft, calf soft, foot cooler to touch however palpable DP. Neuro, Motor and sensory intact. Left: Groin Incision: Dermabond intact, clean and dry, no signs of infection noted. No drainage noted. No hematoma. Thigh soft, calf soft, foot cooler to touch however palpable DT. Neuro, Motor and sensory intact.  Post-op wounds clean, dry, intact or healing well Pt. Ambulating, voiding and taking PO diet without difficulty. Pt pain controlled with PO pain meds. Labs as below Complications:none  Consults:  Treatment Team:  Loletha Grayer, MD Katha Cabal, MD Theodoro Grist, MD  Significant Diagnostic Studies: CBC Lab Results  Component Value Date   WBC 7.0 09/06/2014   HGB 8.8* 09/06/2014   HCT 27.2* 09/06/2014   MCV 85.2 09/06/2014   PLT 175 09/06/2014    BMET    Component Value Date/Time   NA 137 09/01/2014 0227   NA 142 01/10/2014 1528   K 3.6 09/01/2014 0227   K 3.8 01/10/2014 1528   CL 106 09/01/2014 0227   CL 106 01/10/2014 1528   CO2 26 09/01/2014 0227   CO2 31 01/10/2014 1528   GLUCOSE 123* 09/01/2014 0227   GLUCOSE 144* 01/10/2014 1528   BUN 28* 09/01/2014 0227   BUN 37* 01/10/2014 1528   CREATININE 1.43* 09/01/2014 0227   CREATININE 1.87* 01/10/2014 1528   CALCIUM 8.5* 09/01/2014 0227   CALCIUM 9.0 01/10/2014 1528  GFRNONAA 31* 09/01/2014 0227   GFRAA 36* 09/01/2014 0227   COAG Lab Results  Component Value Date   INR 2.75 09/06/2014   INR 2.25 09/05/2014   INR 1.61 09/04/2014     Disposition:  Discharge to :Home Discharge Instructions    Discharge patient    Complete by:  As directed             Medication List    STOP taking these  medications        metroNIDAZOLE 500 MG tablet  Commonly known as:  FLAGYL      TAKE these medications        acetaminophen 325 MG tablet  Commonly known as:  TYLENOL  Take 1-2 tablets (325-650 mg total) by mouth every 4 (four) hours as needed for mild pain (or temp >/= 101 F).     amLODipine 5 MG tablet  Commonly known as:  NORVASC  Take 5 mg by mouth daily.     atorvastatin 20 MG tablet  Commonly known as:  LIPITOR  Take 20 mg by mouth daily.     bumetanide 0.5 MG tablet  Commonly known as:  BUMEX  Take 0.5 mg by mouth daily.     clopidogrel 75 MG tablet  Commonly known as:  PLAVIX  Take 75 mg by mouth daily.     dorzolamide-timolol 22.3-6.8 MG/ML ophthalmic solution  Commonly known as:  COSOPT  Place 1 drop into both eyes 3 (three) times daily.     feeding supplement (ENSURE ENLIVE) Liqd  Take 237 mLs by mouth 3 (three) times daily between meals.     ferrous sulfate 325 (65 FE) MG tablet  Take 1 tablet (325 mg total) by mouth 2 (two) times daily with a meal.     hydrALAZINE 25 MG tablet  Commonly known as:  APRESOLINE  Take 25 mg by mouth daily.     isosorbide dinitrate 30 MG tablet  Commonly known as:  ISORDIL  Take 30 mg by mouth 3 (three) times daily.     loperamide 2 MG capsule  Commonly known as:  IMODIUM  Take 1 capsule (2 mg total) by mouth as needed for diarrhea or loose stools.     olmesartan 40 MG tablet  Commonly known as:  BENICAR  Take 40 mg by mouth daily.     pantoprazole 40 MG tablet  Commonly known as:  PROTONIX  Take 1 tablet (40 mg total) by mouth daily.     solifenacin 5 MG tablet  Commonly known as:  VESICARE  Take 5 mg by mouth daily.     sotalol 120 MG tablet  Commonly known as:  BETAPACE  Take 120 mg by mouth 2 (two) times daily.     Travoprost (BAK Free) 0.004 % Soln ophthalmic solution  Commonly known as:  TRAVATAN  Place 1 drop into both eyes at bedtime.     warfarin 5 MG tablet  Commonly known as:  COUMADIN  Take  1/2 at bedtime. This may change when you follow up with your PCP based on your INR.       Verbal and written Discharge instructions given to the patient. Wound care per Discharge AVS     Follow-up Information    Follow up with Schnier, Dolores Lory, MD In 3 weeks.   Specialties:  Vascular Surgery, Cardiology, Radiology, Vascular Surgery   Why:  You will need an ABI, aorto-iliac and bilateral lower extremity arterial duplex when you follow up  Contact information:   [place] [address] [phone]       Follow up with Cletis Athens, MD.   Specialty:  Internal Medicine   Why:  You MUST follow up Monday or Tuesday for INR check / Afib follow up.    Contact information:   [place] [id number] [phone]     Discharged Condition: good  8 Days Post-Op: Bilateral femoral artery exposure, aorto bi-iliac thromboembolectomy and bilateral femoral-popliteal thromboembolectomy.  Patient states she is feeling better overall. Her diarrhea has stopped. She has been trying to increase her fluid intake by drinking more water and having two ensure a day. We discussed the importance of increasing her food intake as well. She is now therapeutic with an INR of 2.75, heparin has been stopped - medicine recommends discharge home after lunch with coumadin 2.5mg . States her bilateral feet continue to be numb however she is not in pain. She has been OOB walking and working with physical therapy.  1) OK to discharge home later this afternoon, after patient tolerates lunch. 2) Patient to follow up with medical doctor in a few days to monitor INR - I discuss the importance of this with the patient. She expresses her understanding. 3) Discharge to rehab would of been beneficial for patient however she will be discharged home with family and home health services.   Signed: Sela Hua  09/06/2014, 12:28 PM

## 2014-09-06 NOTE — Progress Notes (Signed)
Patient ID: Lori Roberts, female   DOB: Aug 16, 1921, 81 y.o.   MRN: [id number] [facility] PROGRESS NOTE  PCP: Cletis Athens, MD  HPI/Subjective: Patient is not having any loose stools , but appetite is not the best one. However, she is looking forward to drink some ensure . No fevers. No hematochezia. Overall feels comfortable. Coumadin level is therapeutic with INR of 2.75, likely needs to take only 2.5 mg of Coumadin tonight  Objective: Filed Vitals:   09/06/14 0755  BP: 147/103  Pulse: 106  Temp: 98.6 F (37 C)  Resp: 18    Intake/Output Summary (Last 24 hours) at 09/06/14 0941 Last data filed at 09/06/14 0818  Gross per 24 hour  Intake   1716 ml  Output    200 ml  Net   1516 ml   Filed Weights   08/29/14 1702 08/30/14 0200  Weight: 67.132 kg (148 lb) 69 kg (152 lb 1.9 oz)    ROS: Review of Systems  Constitutional: Negative for fever, chills and weight loss.  HENT: Negative for congestion.   Eyes: Negative for blurred vision and double vision.  Respiratory: Negative for cough, sputum production, shortness of breath and wheezing.   Cardiovascular: Negative for chest pain, palpitations, orthopnea, leg swelling and PND.  Gastrointestinal: Positive for nausea. Negative for vomiting, abdominal pain, diarrhea, constipation and blood in stool.  Genitourinary: Negative for dysuria, urgency, frequency and hematuria.  Musculoskeletal: Negative for joint pain and falls.  Neurological: Negative for dizziness, tremors, focal weakness and headaches.  Endo/Heme/Allergies: Does not bruise/bleed easily.  Psychiatric/Behavioral: Negative for depression. The patient does not have insomnia.    Exam: Physical Exam  Constitutional: She appears well-developed and well-nourished.  HENT:  Head: Normocephalic and atraumatic.  Nose: No mucosal edema.  Mouth/Throat: No oropharyngeal exudate or posterior oropharyngeal edema.  Eyes: Conjunctivae, EOM and lids are normal. Pupils  are equal, round, and reactive to light.  Neck: No JVD present. Carotid bruit is not present. No edema present. No thyroid mass and no thyromegaly present.  Cardiovascular: S1 normal and S2 normal.  Exam reveals no gallop.   Murmur heard.  Systolic murmur is present with a grade of 3/6  Pulses:      Dorsalis pedis pulses are 2+ on the right side, and 2+ on the left side.  Respiratory: No respiratory distress. She has no wheezes. She has no rhonchi. She has no rales.  GI: Soft. Bowel sounds are normal. There is no tenderness.  Musculoskeletal:       Right ankle: She exhibits swelling.       Left ankle: She exhibits swelling.  Lymphadenopathy:    She has no cervical adenopathy.  Neurological: She is alert. No cranial nerve deficit.  Skin: Skin is warm. No rash noted. Nails show no clubbing.  Bilateral groin incision site clean and dry.  Psychiatric: She has a normal mood and affect.   Diminished dorsalis pulses bilaterally but feet are warm Data Reviewed: Basic Metabolic Panel:  Recent Labs Lab 08/30/14 2246 08/31/14 0708 09/01/14 0227  NA 139 140 137  K 4.0 3.8 3.6  CL 106 106 106  CO2 24 28 26   GLUCOSE 133* 130* 123*  BUN 29* 32* 28*  CREATININE 1.36* 1.44* 1.43*  CALCIUM 8.4* 8.6* 8.5*   Liver Function Tests: No results for input(s): AST, ALT, ALKPHOS, BILITOT, PROT, ALBUMIN in the last 168 hours.  CBC:  Recent Labs Lab 09/01/14 0437 09/02/14 0500 09/03/14 0525 09/04/14 0542 09/05/14 0449 09/06/14  0424  WBC 9.2 9.3 9.5 8.7 7.8 7.0  NEUTROABS 7.2*  --   --   --   --   --   HGB 8.4* 9.0* 8.9* 8.9* 8.8* 8.8*  HCT 25.4* 26.4* 27.3* 27.6* 27.0* 27.2*  MCV 85.9 83.7 85.0 84.4 85.0 85.2  PLT 109* 123* 152 182 182 175   Scheduled Meds: . amLODipine  5 mg Oral Daily  . atorvastatin  20 mg Oral Daily  . darifenacin  7.5 mg Oral Daily  . docusate sodium  100 mg Oral Daily  . dorzolamide-timolol  1 drop Both Eyes TID  . feeding supplement (ENSURE ENLIVE)  237 mL  Oral TID BM  . ferrous sulfate  325 mg Oral BID WC  . hydrALAZINE  25 mg Oral Daily  . irbesartan  37.5 mg Oral Daily  . isosorbide dinitrate  30 mg Oral TID  . latanoprost  1 drop Both Eyes QHS  . pantoprazole  40 mg Oral Daily  . sotalol  120 mg Oral BID  . warfarin  5 mg Oral q1800  . Warfarin - Pharmacist Dosing Inpatient   Does not apply q1800   Continuous Infusions:    Assessment/Plan:  1. Chronic atrial fibrillation/ bradycardia- decreased sotalol dose to 80 mg twice daily few days ago , then became tachycardic, now back on advanced to home doses. INR is therapeutic, off heparin drip.  Needs to have close follow-up on INR as outpatient , since we want to keep it therapeutic 2. Recent GI bleed, postoperative anemia- continue to monitor closely. Patient is on Protonix empirically. Hemoglobin remains stable.  Continue ferrous sulfate.  3. Embolization and distal aortic occlusion- status post bilateral procedures by vascular surgery. Off heparin drip. Further management as per vascular surgery. Likely home with family today by vascular surgery , since INR therapeutic 4. Essential hypertension blood pressure currently stable on usual medications. 5. Hyperlipidemia unspecified on atorvastatin. 6. Glaucoma on specified continue latanoprost. 7. Borderline elevated troponin likely demand ischemia. 8. Hypokalemia -replaced 9. Diarrhea, possibly related to Colace as well as MiraLAX was which was given in the past. C. difficile negative.  Continue Protonix, resolved clinically 10. Thrombocytopenia- looking back at old labs looks chronic. Dehydration. Follow patient closely in regards to oral intake on full liquid diet. If patient tolerates ensure she could go home today after lunch  Code Status:     Code Status Orders        Start     Ordered   08/30/14 0217  Full code   Continuous     08/30/14 0216    Advance Directive Documentation        Most Recent Value   Type of Advance  Directive  Living will   Pre-existing out of facility DNR order (yellow form or pink MOST form)     "MOST" Form in Place?       Disposition Plan: Home with home health with niece once INR therapeutic.  Time spent: 35 minutes  [facility]

## 2014-09-06 NOTE — Progress Notes (Signed)
[facility]  Daily Progress Note  Subjective: 8 Days Post-Op: Bilateral femoral artery exposure, aorto bi-iliac thromboembolectomy and bilateral femoral-popliteal thromboembolectomy.  Patient states she is feeling better overall. Her diarrhea has stopped. She has been trying to increase her fluid intake by drinking more water and having two ensure a day. We discussed the importance of increasing her food intake as well. She is now therapeutic with an INR of 2.75, heparin has been stopped - medicine recommends discharge home after lunch with coumadin 2.5mg . States her bilateral feet continue to be numb however she is not in pain. She has been OOB walking and working with physical therapy.   Objective: Filed Vitals:   09/05/14 1634 09/05/14 2003 09/06/14 0755 09/06/14 0951  BP: 140/61 110/61 147/103 126/70  Pulse: 82 103 106 82  Temp: 98.6 F (37 C) 99 F (37.2 C) 98.6 F (37 C)   TempSrc: Oral Oral Oral   Resp: 18 16 18    Height:      Weight:      SpO2: 96% 98% 97%     Intake/Output Summary (Last 24 hours) at 09/06/14 1113 Last data filed at 09/06/14 1012  Gross per 24 hour  Intake   1836 ml  Output    201 ml  Net   1635 ml    Physical Exam: A&Ox3, NAD CV: RRR Pulmonary: CTA Bilaterally Abdomen: Soft, Nontender, Nondistended Vascular: Right: Groin Incision: Dermabond intact, clean and dry, no signs of infection noted. No drainage noted. No hematoma. Thigh soft, calf soft, foot cooler to touch however palpable DP. Neuro, Motor and sensory intact. Left: Groin Incision: Dermabond intact, clean and dry, no signs of infection noted. No drainage noted. No hematoma. Thigh soft, calf soft, foot cooler to touch however palpable DT. Neuro, Motor and sensory intact.   Laboratory: CBC    Component Value Date/Time   WBC 7.0 09/06/2014 0424   WBC 7.0 01/10/2014 1528   HGB 8.8* 09/06/2014 0424   HGB 13.0 01/10/2014 1528   HCT 27.2*  09/06/2014 0424   HCT 39.5 01/10/2014 1528   PLT 175 09/06/2014 0424   PLT 147* 01/10/2014 1528    BMET    Component Value Date/Time   NA 137 09/01/2014 0227   NA 142 01/10/2014 1528   K 3.6 09/01/2014 0227   K 3.8 01/10/2014 1528   CL 106 09/01/2014 0227   CL 106 01/10/2014 1528   CO2 26 09/01/2014 0227   CO2 31 01/10/2014 1528   GLUCOSE 123* 09/01/2014 0227   GLUCOSE 144* 01/10/2014 1528   BUN 28* 09/01/2014 0227   BUN 37* 01/10/2014 1528   CREATININE 1.43* 09/01/2014 0227   CREATININE 1.87* 01/10/2014 1528   CALCIUM 8.5* 09/01/2014 0227   CALCIUM 9.0 01/10/2014 1528   GFRNONAA 31* 09/01/2014 0227   GFRAA 36* 09/01/2014 0227    Assessment/Planning: 8 Days Post-Op: Bilateral femoral artery exposure, aorto bi-iliac thromboembolectomy and bilateral femoral-popliteal thromboembolectomy.  Patient states she is feeling better overall. Her diarrhea has stopped. She has been trying to increase her fluid intake by drinking more water and having two ensure a day. We discussed the importance of increasing her food intake as well. She is now therapeutic with an INR of 2.75, heparin has been stopped - medicine recommends discharge home after lunch with coumadin 2.5mg . States her bilateral feet continue to be numb however she is not in pain. She has been OOB walking and working with physical therapy.  1) OK to  discharge home later this afternoon, after patient tolerates lunch. 2) Patient to follow up with medical doctor in a few days to monitor INR - I discuss the importance of this with the patient. She expresses her understanding. 3) Discharge to rehab would of been beneficial for patient however she will be discharged home with family and home health services.  4) Patient to follow up in our office in 3 weeks with ABI and aortoiliac duplex exam.  Marcelle Overlie PA-C 09/06/2014 11:13 AM

## 2014-09-06 NOTE — Progress Notes (Signed)
Pt A and O x 4. VSS. Pt has incisions with no bleeding or drainage. Pt tolerating diet other than diarrhea which is resolving. Pt had no complaints of pain or nausea. Pt had IV removed and prescriptions given. Pt voiced that she understood discharge instructions with no questions. Pt left via wheelchair with aide.

## 2014-09-08 LAB — STOOL CULTURE

## 2014-09-09 DIAGNOSIS — I4891 Unspecified atrial fibrillation: Secondary | ICD-10-CM | POA: Diagnosis not present

## 2014-09-09 DIAGNOSIS — I10 Essential (primary) hypertension: Secondary | ICD-10-CM | POA: Diagnosis not present

## 2014-09-09 DIAGNOSIS — I209 Angina pectoris, unspecified: Secondary | ICD-10-CM | POA: Diagnosis not present

## 2014-09-09 DIAGNOSIS — I150 Renovascular hypertension: Secondary | ICD-10-CM | POA: Diagnosis not present

## 2014-09-15 ENCOUNTER — Ambulatory Visit: Payer: Medicare HMO

## 2014-09-19 DIAGNOSIS — I4891 Unspecified atrial fibrillation: Secondary | ICD-10-CM | POA: Diagnosis not present

## 2014-10-13 DIAGNOSIS — R300 Dysuria: Secondary | ICD-10-CM | POA: Diagnosis not present

## 2014-10-13 DIAGNOSIS — I714 Abdominal aortic aneurysm, without rupture: Secondary | ICD-10-CM | POA: Diagnosis not present

## 2014-10-13 DIAGNOSIS — I743 Embolism and thrombosis of arteries of the lower extremities: Secondary | ICD-10-CM | POA: Diagnosis not present

## 2014-10-13 DIAGNOSIS — I4891 Unspecified atrial fibrillation: Secondary | ICD-10-CM | POA: Diagnosis not present

## 2014-10-13 DIAGNOSIS — N390 Urinary tract infection, site not specified: Secondary | ICD-10-CM | POA: Diagnosis not present

## 2014-10-20 DIAGNOSIS — I743 Embolism and thrombosis of arteries of the lower extremities: Secondary | ICD-10-CM | POA: Diagnosis not present

## 2014-10-20 DIAGNOSIS — I701 Atherosclerosis of renal artery: Secondary | ICD-10-CM | POA: Diagnosis not present

## 2014-10-20 DIAGNOSIS — I10 Essential (primary) hypertension: Secondary | ICD-10-CM | POA: Diagnosis not present

## 2014-10-20 DIAGNOSIS — E785 Hyperlipidemia, unspecified: Secondary | ICD-10-CM | POA: Diagnosis not present

## 2014-10-20 DIAGNOSIS — I714 Abdominal aortic aneurysm, without rupture: Secondary | ICD-10-CM | POA: Diagnosis not present

## 2014-10-21 ENCOUNTER — Ambulatory Visit: Payer: Commercial Managed Care - HMO

## 2014-11-03 ENCOUNTER — Ambulatory Visit (INDEPENDENT_AMBULATORY_CARE_PROVIDER_SITE_OTHER): Payer: PPO | Admitting: Podiatry

## 2014-11-03 DIAGNOSIS — M79676 Pain in unspecified toe(s): Secondary | ICD-10-CM

## 2014-11-03 DIAGNOSIS — B351 Tinea unguium: Secondary | ICD-10-CM | POA: Diagnosis not present

## 2014-11-03 NOTE — Progress Notes (Signed)
She presents today chief complaint of painful elongated toenails. She states that she had a bilateral common iliac blockage.  Objective: Thick yellow dystrophic with mycotic and painful palpation. Strong palpable pulses bilateral no open lesions no sores.  Assessment: Pain in limb secondary to onychomycosis 1 through 5 bilateral.  Plan: Debridement of nails 1 through 5 bilateral covered service secondary to pain. 3 mo.          Had  Leg clot Sx 2 months ago (did well)

## 2015-02-02 ENCOUNTER — Ambulatory Visit (INDEPENDENT_AMBULATORY_CARE_PROVIDER_SITE_OTHER): Payer: PPO | Admitting: Podiatry

## 2015-02-02 DIAGNOSIS — M79676 Pain in unspecified toe(s): Secondary | ICD-10-CM

## 2015-02-02 DIAGNOSIS — B351 Tinea unguium: Secondary | ICD-10-CM | POA: Diagnosis not present

## 2015-02-02 NOTE — Progress Notes (Signed)
She presents today chief complaint of painful elongated toenails. She states that she had a bilateral common iliac blockage.  Objective: Thick yellow dystrophic with mycotic and painful palpation. Strong palpable pulses bilateral no open lesions no sores.  Assessment: Pain in limb secondary to onychomycosis 1 through 5 bilateral.  Plan: Debridement of nails 1 through 5 bilateral covered service secondary to pain. 3 mo.          Had  Leg clot Sx 5  months ago (did well).  Roselind Messier DPM

## 2015-04-20 DIAGNOSIS — I359 Nonrheumatic aortic valve disorder, unspecified: Secondary | ICD-10-CM | POA: Diagnosis not present

## 2015-04-20 DIAGNOSIS — I4891 Unspecified atrial fibrillation: Secondary | ICD-10-CM | POA: Diagnosis not present

## 2015-04-20 DIAGNOSIS — E8881 Metabolic syndrome: Secondary | ICD-10-CM | POA: Diagnosis not present

## 2015-04-20 DIAGNOSIS — N3946 Mixed incontinence: Secondary | ICD-10-CM | POA: Diagnosis not present

## 2015-05-06 ENCOUNTER — Ambulatory Visit (INDEPENDENT_AMBULATORY_CARE_PROVIDER_SITE_OTHER): Payer: PPO | Admitting: Podiatry

## 2015-05-06 ENCOUNTER — Encounter: Payer: Self-pay | Admitting: Podiatry

## 2015-05-06 DIAGNOSIS — M79676 Pain in unspecified toe(s): Secondary | ICD-10-CM

## 2015-05-06 DIAGNOSIS — B351 Tinea unguium: Secondary | ICD-10-CM

## 2015-05-06 NOTE — Progress Notes (Signed)
She presents today chief complaint of painful elongated toenails bilateral.  Objective: Vital signs are stable alert and oriented 3. Pulses are strongly palpable. Her toenails are thick yellow dystrophic with mycotic and painful palpation.  Assessment: Pain in limb secondary to onychomycosis.  Plan: Debridement of nails 1 through 5 bilateral.

## 2015-05-14 DIAGNOSIS — H401132 Primary open-angle glaucoma, bilateral, moderate stage: Secondary | ICD-10-CM | POA: Diagnosis not present

## 2015-05-18 DIAGNOSIS — I4891 Unspecified atrial fibrillation: Secondary | ICD-10-CM | POA: Diagnosis not present

## 2015-05-18 DIAGNOSIS — I359 Nonrheumatic aortic valve disorder, unspecified: Secondary | ICD-10-CM | POA: Diagnosis not present

## 2015-05-18 DIAGNOSIS — N183 Chronic kidney disease, stage 3 (moderate): Secondary | ICD-10-CM | POA: Diagnosis not present

## 2015-05-18 DIAGNOSIS — E8881 Metabolic syndrome: Secondary | ICD-10-CM | POA: Diagnosis not present

## 2015-06-16 DIAGNOSIS — I359 Nonrheumatic aortic valve disorder, unspecified: Secondary | ICD-10-CM | POA: Diagnosis not present

## 2015-06-16 DIAGNOSIS — E8881 Metabolic syndrome: Secondary | ICD-10-CM | POA: Diagnosis not present

## 2015-06-16 DIAGNOSIS — N3946 Mixed incontinence: Secondary | ICD-10-CM | POA: Diagnosis not present

## 2015-06-16 DIAGNOSIS — N183 Chronic kidney disease, stage 3 (moderate): Secondary | ICD-10-CM | POA: Diagnosis not present

## 2015-07-16 DIAGNOSIS — I4891 Unspecified atrial fibrillation: Secondary | ICD-10-CM | POA: Diagnosis not present

## 2015-07-16 DIAGNOSIS — E784 Other hyperlipidemia: Secondary | ICD-10-CM | POA: Diagnosis not present

## 2015-07-16 DIAGNOSIS — E8881 Metabolic syndrome: Secondary | ICD-10-CM | POA: Diagnosis not present

## 2015-07-20 DIAGNOSIS — H26492 Other secondary cataract, left eye: Secondary | ICD-10-CM | POA: Diagnosis not present

## 2015-07-27 DIAGNOSIS — H26492 Other secondary cataract, left eye: Secondary | ICD-10-CM | POA: Diagnosis not present

## 2015-08-05 ENCOUNTER — Encounter: Payer: Self-pay | Admitting: Podiatry

## 2015-08-05 ENCOUNTER — Ambulatory Visit (INDEPENDENT_AMBULATORY_CARE_PROVIDER_SITE_OTHER): Payer: PPO | Admitting: Podiatry

## 2015-08-05 DIAGNOSIS — B351 Tinea unguium: Secondary | ICD-10-CM

## 2015-08-05 DIAGNOSIS — M79676 Pain in unspecified toe(s): Secondary | ICD-10-CM

## 2015-08-05 NOTE — Progress Notes (Signed)
She presents today with chief complaint of painful elongated toenails.  Objective: All signs are stable alert and oriented 3. Pulses are strongly palpable. Toenails are thick yellow dystrophic onychomycotic. Reactive distal clavus second digit right foot.  Assessment: Pain in limb secondary to onychomycosis porokeratosis.  Plan: Debridement of reactive hyperkeratosis and toenails 1 through 5 bilateral.

## 2015-08-14 DIAGNOSIS — E8881 Metabolic syndrome: Secondary | ICD-10-CM | POA: Diagnosis not present

## 2015-08-14 DIAGNOSIS — E784 Other hyperlipidemia: Secondary | ICD-10-CM | POA: Diagnosis not present

## 2015-08-14 DIAGNOSIS — I4891 Unspecified atrial fibrillation: Secondary | ICD-10-CM | POA: Diagnosis not present

## 2015-08-14 DIAGNOSIS — N183 Chronic kidney disease, stage 3 (moderate): Secondary | ICD-10-CM | POA: Diagnosis not present

## 2015-09-14 DIAGNOSIS — N183 Chronic kidney disease, stage 3 (moderate): Secondary | ICD-10-CM | POA: Diagnosis not present

## 2015-09-14 DIAGNOSIS — E8881 Metabolic syndrome: Secondary | ICD-10-CM | POA: Diagnosis not present

## 2015-09-14 DIAGNOSIS — I4891 Unspecified atrial fibrillation: Secondary | ICD-10-CM | POA: Diagnosis not present

## 2015-09-14 DIAGNOSIS — I150 Renovascular hypertension: Secondary | ICD-10-CM | POA: Diagnosis not present

## 2015-10-13 DIAGNOSIS — R5381 Other malaise: Secondary | ICD-10-CM | POA: Diagnosis not present

## 2015-10-13 DIAGNOSIS — I10 Essential (primary) hypertension: Secondary | ICD-10-CM | POA: Diagnosis not present

## 2015-10-13 DIAGNOSIS — I4891 Unspecified atrial fibrillation: Secondary | ICD-10-CM | POA: Diagnosis not present

## 2015-10-13 DIAGNOSIS — I509 Heart failure, unspecified: Secondary | ICD-10-CM | POA: Diagnosis not present

## 2015-10-13 DIAGNOSIS — E784 Other hyperlipidemia: Secondary | ICD-10-CM | POA: Diagnosis not present

## 2015-10-13 DIAGNOSIS — N183 Chronic kidney disease, stage 3 (moderate): Secondary | ICD-10-CM | POA: Diagnosis not present

## 2015-10-13 DIAGNOSIS — I130 Hypertensive heart and chronic kidney disease with heart failure and stage 1 through stage 4 chronic kidney disease, or unspecified chronic kidney disease: Secondary | ICD-10-CM | POA: Diagnosis not present

## 2015-10-14 DIAGNOSIS — L820 Inflamed seborrheic keratosis: Secondary | ICD-10-CM | POA: Diagnosis not present

## 2015-10-14 DIAGNOSIS — L821 Other seborrheic keratosis: Secondary | ICD-10-CM | POA: Diagnosis not present

## 2015-10-14 DIAGNOSIS — L570 Actinic keratosis: Secondary | ICD-10-CM | POA: Diagnosis not present

## 2015-10-14 DIAGNOSIS — D692 Other nonthrombocytopenic purpura: Secondary | ICD-10-CM | POA: Diagnosis not present

## 2015-10-14 DIAGNOSIS — L578 Other skin changes due to chronic exposure to nonionizing radiation: Secondary | ICD-10-CM | POA: Diagnosis not present

## 2015-10-20 DIAGNOSIS — E785 Hyperlipidemia, unspecified: Secondary | ICD-10-CM | POA: Diagnosis not present

## 2015-10-20 DIAGNOSIS — I743 Embolism and thrombosis of arteries of the lower extremities: Secondary | ICD-10-CM | POA: Diagnosis not present

## 2015-10-20 DIAGNOSIS — I714 Abdominal aortic aneurysm, without rupture: Secondary | ICD-10-CM | POA: Diagnosis not present

## 2015-10-20 DIAGNOSIS — I10 Essential (primary) hypertension: Secondary | ICD-10-CM | POA: Diagnosis not present

## 2015-10-20 DIAGNOSIS — I701 Atherosclerosis of renal artery: Secondary | ICD-10-CM | POA: Diagnosis not present

## 2015-11-09 ENCOUNTER — Ambulatory Visit: Payer: PPO | Admitting: Podiatry

## 2015-11-16 ENCOUNTER — Ambulatory Visit (INDEPENDENT_AMBULATORY_CARE_PROVIDER_SITE_OTHER): Payer: PPO | Admitting: Podiatry

## 2015-11-16 DIAGNOSIS — M79676 Pain in unspecified toe(s): Secondary | ICD-10-CM | POA: Diagnosis not present

## 2015-11-16 DIAGNOSIS — B351 Tinea unguium: Secondary | ICD-10-CM

## 2015-11-16 NOTE — Progress Notes (Signed)
She presents today with chief complaint of painful elongated toenails.  Objective: Toenails are thick yellow dystrophic with mycotic pulses are palpable. No open lesions or wounds.  Assessment: Pain limb secondary to onychomycosis.  Plan: Debridement of toenails 1 through 5 bilateral, service secondary to pain.

## 2015-11-18 DIAGNOSIS — H401132 Primary open-angle glaucoma, bilateral, moderate stage: Secondary | ICD-10-CM | POA: Diagnosis not present

## 2015-12-17 DIAGNOSIS — E784 Other hyperlipidemia: Secondary | ICD-10-CM | POA: Diagnosis not present

## 2015-12-17 DIAGNOSIS — N183 Chronic kidney disease, stage 3 (moderate): Secondary | ICD-10-CM | POA: Diagnosis not present

## 2015-12-17 DIAGNOSIS — I509 Heart failure, unspecified: Secondary | ICD-10-CM | POA: Diagnosis not present

## 2015-12-17 DIAGNOSIS — E8881 Metabolic syndrome: Secondary | ICD-10-CM | POA: Diagnosis not present

## 2015-12-17 DIAGNOSIS — I4891 Unspecified atrial fibrillation: Secondary | ICD-10-CM | POA: Diagnosis not present

## 2016-01-14 DIAGNOSIS — L578 Other skin changes due to chronic exposure to nonionizing radiation: Secondary | ICD-10-CM | POA: Diagnosis not present

## 2016-01-14 DIAGNOSIS — D692 Other nonthrombocytopenic purpura: Secondary | ICD-10-CM | POA: Diagnosis not present

## 2016-01-14 DIAGNOSIS — L570 Actinic keratosis: Secondary | ICD-10-CM | POA: Diagnosis not present

## 2016-01-14 DIAGNOSIS — L812 Freckles: Secondary | ICD-10-CM | POA: Diagnosis not present

## 2016-01-14 DIAGNOSIS — L821 Other seborrheic keratosis: Secondary | ICD-10-CM | POA: Diagnosis not present

## 2016-01-15 DIAGNOSIS — I130 Hypertensive heart and chronic kidney disease with heart failure and stage 1 through stage 4 chronic kidney disease, or unspecified chronic kidney disease: Secondary | ICD-10-CM | POA: Diagnosis not present

## 2016-01-15 DIAGNOSIS — S20229A Contusion of unspecified back wall of thorax, initial encounter: Secondary | ICD-10-CM | POA: Diagnosis not present

## 2016-01-15 DIAGNOSIS — I4891 Unspecified atrial fibrillation: Secondary | ICD-10-CM | POA: Diagnosis not present

## 2016-01-15 DIAGNOSIS — R5381 Other malaise: Secondary | ICD-10-CM | POA: Diagnosis not present

## 2016-01-15 DIAGNOSIS — M545 Low back pain: Secondary | ICD-10-CM | POA: Diagnosis not present

## 2016-01-18 DIAGNOSIS — T15 Foreign body in cornea: Secondary | ICD-10-CM | POA: Diagnosis not present

## 2016-01-18 DIAGNOSIS — S20229A Contusion of unspecified back wall of thorax, initial encounter: Secondary | ICD-10-CM | POA: Diagnosis not present

## 2016-01-18 DIAGNOSIS — Z23 Encounter for immunization: Secondary | ICD-10-CM | POA: Diagnosis not present

## 2016-01-18 DIAGNOSIS — N3946 Mixed incontinence: Secondary | ICD-10-CM | POA: Diagnosis not present

## 2016-01-29 DIAGNOSIS — S20229A Contusion of unspecified back wall of thorax, initial encounter: Secondary | ICD-10-CM | POA: Diagnosis not present

## 2016-01-29 DIAGNOSIS — M545 Low back pain: Secondary | ICD-10-CM | POA: Diagnosis not present

## 2016-01-29 DIAGNOSIS — N3946 Mixed incontinence: Secondary | ICD-10-CM | POA: Diagnosis not present

## 2016-01-29 DIAGNOSIS — I359 Nonrheumatic aortic valve disorder, unspecified: Secondary | ICD-10-CM | POA: Diagnosis not present

## 2016-02-16 ENCOUNTER — Ambulatory Visit (INDEPENDENT_AMBULATORY_CARE_PROVIDER_SITE_OTHER): Payer: PPO | Admitting: Podiatry

## 2016-02-16 ENCOUNTER — Encounter: Payer: Self-pay | Admitting: Podiatry

## 2016-02-16 DIAGNOSIS — L603 Nail dystrophy: Secondary | ICD-10-CM

## 2016-02-16 DIAGNOSIS — B351 Tinea unguium: Secondary | ICD-10-CM | POA: Diagnosis not present

## 2016-02-16 DIAGNOSIS — M79609 Pain in unspecified limb: Secondary | ICD-10-CM

## 2016-02-16 DIAGNOSIS — L608 Other nail disorders: Secondary | ICD-10-CM

## 2016-02-16 NOTE — Progress Notes (Signed)
SUBJECTIVE Patient  presents to office today complaining of elongated, thickened nails. Pain while ambulating in shoes. Patient is unable to trim their own nails.   OBJECTIVE General Patient is awake, alert, and oriented x 3 and in no acute distress. Derm Skin is dry and supple bilateral. Negative open lesions or macerations. Remaining integument unremarkable. Nails are tender, long, thickened and dystrophic with subungual debris, consistent with onychomycosis, 1-5 bilateral. No signs of infection noted. Vasc  DP and PT pedal pulses palpable bilaterally. Temperature gradient within normal limits.  Neuro Epicritic and protective threshold sensation diminished bilaterally.  Musculoskeletal Exam No symptomatic pedal deformities noted bilateral. Muscular strength within normal limits.  ASSESSMENT 1. Onychodystrophic nails 1-5 bilateral with hyperkeratosis of nails.  2. Onychomycosis of nail due to dermatophyte bilateral 3. Pain in foot bilateral  PLAN OF CARE 1. Patient evaluated today.  2. Instructed to maintain good pedal hygiene and foot care.  3. Mechanical debridement of nails 1-5 bilaterally performed using a nail nipper. Filed with dremel without incident.  4. Return to clinic in 3 mos.    Alysse Rathe M Skyler Dusing, DPM    

## 2016-02-26 DIAGNOSIS — I4891 Unspecified atrial fibrillation: Secondary | ICD-10-CM | POA: Diagnosis not present

## 2016-02-26 DIAGNOSIS — I130 Hypertensive heart and chronic kidney disease with heart failure and stage 1 through stage 4 chronic kidney disease, or unspecified chronic kidney disease: Secondary | ICD-10-CM | POA: Diagnosis not present

## 2016-02-26 DIAGNOSIS — I509 Heart failure, unspecified: Secondary | ICD-10-CM | POA: Diagnosis not present

## 2016-02-26 DIAGNOSIS — N183 Chronic kidney disease, stage 3 (moderate): Secondary | ICD-10-CM | POA: Diagnosis not present

## 2016-03-28 DIAGNOSIS — N183 Chronic kidney disease, stage 3 (moderate): Secondary | ICD-10-CM | POA: Diagnosis not present

## 2016-03-28 DIAGNOSIS — R0602 Shortness of breath: Secondary | ICD-10-CM | POA: Diagnosis not present

## 2016-03-28 DIAGNOSIS — I10 Essential (primary) hypertension: Secondary | ICD-10-CM | POA: Diagnosis not present

## 2016-03-28 DIAGNOSIS — E8881 Metabolic syndrome: Secondary | ICD-10-CM | POA: Diagnosis not present

## 2016-03-28 DIAGNOSIS — I4891 Unspecified atrial fibrillation: Secondary | ICD-10-CM | POA: Diagnosis not present

## 2016-03-29 DIAGNOSIS — I2510 Atherosclerotic heart disease of native coronary artery without angina pectoris: Secondary | ICD-10-CM | POA: Diagnosis not present

## 2016-03-29 DIAGNOSIS — N182 Chronic kidney disease, stage 2 (mild): Secondary | ICD-10-CM | POA: Diagnosis not present

## 2016-03-29 DIAGNOSIS — E785 Hyperlipidemia, unspecified: Secondary | ICD-10-CM | POA: Diagnosis not present

## 2016-04-11 DIAGNOSIS — I10 Essential (primary) hypertension: Secondary | ICD-10-CM | POA: Diagnosis not present

## 2016-04-11 DIAGNOSIS — R0602 Shortness of breath: Secondary | ICD-10-CM | POA: Diagnosis not present

## 2016-04-11 DIAGNOSIS — N183 Chronic kidney disease, stage 3 (moderate): Secondary | ICD-10-CM | POA: Diagnosis not present

## 2016-04-11 DIAGNOSIS — I4891 Unspecified atrial fibrillation: Secondary | ICD-10-CM | POA: Diagnosis not present

## 2016-04-25 DIAGNOSIS — I4891 Unspecified atrial fibrillation: Secondary | ICD-10-CM | POA: Diagnosis not present

## 2016-04-25 DIAGNOSIS — I10 Essential (primary) hypertension: Secondary | ICD-10-CM | POA: Diagnosis not present

## 2016-04-25 DIAGNOSIS — N183 Chronic kidney disease, stage 3 (moderate): Secondary | ICD-10-CM | POA: Diagnosis not present

## 2016-04-25 DIAGNOSIS — R0602 Shortness of breath: Secondary | ICD-10-CM | POA: Diagnosis not present

## 2016-05-19 ENCOUNTER — Ambulatory Visit: Payer: PPO | Admitting: Podiatry

## 2016-05-23 ENCOUNTER — Ambulatory Visit: Payer: PPO | Admitting: Podiatry

## 2016-06-06 DIAGNOSIS — I359 Nonrheumatic aortic valve disorder, unspecified: Secondary | ICD-10-CM | POA: Diagnosis not present

## 2016-06-06 DIAGNOSIS — I130 Hypertensive heart and chronic kidney disease with heart failure and stage 1 through stage 4 chronic kidney disease, or unspecified chronic kidney disease: Secondary | ICD-10-CM | POA: Diagnosis not present

## 2016-06-06 DIAGNOSIS — N183 Chronic kidney disease, stage 3 (moderate): Secondary | ICD-10-CM | POA: Diagnosis not present

## 2016-06-20 ENCOUNTER — Ambulatory Visit (INDEPENDENT_AMBULATORY_CARE_PROVIDER_SITE_OTHER): Payer: PPO | Admitting: Podiatry

## 2016-06-20 ENCOUNTER — Encounter: Payer: Self-pay | Admitting: Podiatry

## 2016-06-20 DIAGNOSIS — M79676 Pain in unspecified toe(s): Secondary | ICD-10-CM | POA: Diagnosis not present

## 2016-06-20 DIAGNOSIS — B351 Tinea unguium: Secondary | ICD-10-CM | POA: Diagnosis not present

## 2016-06-20 DIAGNOSIS — M79609 Pain in unspecified limb: Principal | ICD-10-CM

## 2016-06-20 NOTE — Progress Notes (Signed)
Complaint:  Visit Type: Patient returns to my office for continued preventative foot care services. Complaint: Patient states" my nails have grown long and thick and become painful to walk and wear shoes"  The patient presents for preventative foot care services. No changes to ROS  Podiatric Exam: Vascular: dorsalis pedis and posterior tibial pulses are palpable bilateral. Capillary return is immediate. Temperature gradient is WNL. Skin turgor WNL  Sensorium: Dimished  Semmes Weinstein monofilament test. Normal tactile sensation bilaterally. Nail Exam: Pt has thick disfigured discolored nails with subungual debris noted bilateral entire nail hallux through fifth toenails Ulcer Exam: There is no evidence of ulcer or pre-ulcerative changes or infection. Orthopedic Exam: Muscle tone and strength are WNL. No limitations in general ROM. No crepitus or effusions noted. Foot type and digits show no abnormalities. Bony prominences are unremarkable. Skin: No Porokeratosis. No infection or ulcers  Diagnosis:  Onychomycosis, , Pain in right toe, pain in left toes  Treatment & Plan Procedures and Treatment: Consent by patient was obtained for treatment procedures. The patient understood the discussion of treatment and procedures well. All questions were answered thoroughly reviewed. Debridement of mycotic and hypertrophic toenails, 1 through 5 bilateral and clearing of subungual debris. No ulceration, no infection noted. Patient is developing chronic ulcer on lateral malleolus right ankle.  Prescribe heel pillow. Return Visit-Office Procedure: Patient instructed to return to the office for a follow up visit 3 months for continued evaluation and treatment.    Gardiner Barefoot DPM

## 2016-07-06 DIAGNOSIS — H401132 Primary open-angle glaucoma, bilateral, moderate stage: Secondary | ICD-10-CM | POA: Diagnosis not present

## 2016-07-11 DIAGNOSIS — S20229A Contusion of unspecified back wall of thorax, initial encounter: Secondary | ICD-10-CM | POA: Diagnosis not present

## 2016-07-11 DIAGNOSIS — H538 Other visual disturbances: Secondary | ICD-10-CM | POA: Diagnosis not present

## 2016-07-11 DIAGNOSIS — M539 Dorsopathy, unspecified: Secondary | ICD-10-CM | POA: Diagnosis not present

## 2016-07-11 DIAGNOSIS — I4891 Unspecified atrial fibrillation: Secondary | ICD-10-CM | POA: Diagnosis not present

## 2016-07-11 DIAGNOSIS — I130 Hypertensive heart and chronic kidney disease with heart failure and stage 1 through stage 4 chronic kidney disease, or unspecified chronic kidney disease: Secondary | ICD-10-CM | POA: Diagnosis not present

## 2016-07-11 DIAGNOSIS — N3946 Mixed incontinence: Secondary | ICD-10-CM | POA: Diagnosis not present

## 2016-07-11 DIAGNOSIS — N183 Chronic kidney disease, stage 3 (moderate): Secondary | ICD-10-CM | POA: Diagnosis not present

## 2016-07-21 DIAGNOSIS — D229 Melanocytic nevi, unspecified: Secondary | ICD-10-CM | POA: Diagnosis not present

## 2016-07-21 DIAGNOSIS — L570 Actinic keratosis: Secondary | ICD-10-CM | POA: Diagnosis not present

## 2016-07-21 DIAGNOSIS — L812 Freckles: Secondary | ICD-10-CM | POA: Diagnosis not present

## 2016-07-21 DIAGNOSIS — L821 Other seborrheic keratosis: Secondary | ICD-10-CM | POA: Diagnosis not present

## 2016-07-21 DIAGNOSIS — L578 Other skin changes due to chronic exposure to nonionizing radiation: Secondary | ICD-10-CM | POA: Diagnosis not present

## 2016-07-27 DIAGNOSIS — L97319 Non-pressure chronic ulcer of right ankle with unspecified severity: Secondary | ICD-10-CM | POA: Diagnosis not present

## 2016-08-08 DIAGNOSIS — I10 Essential (primary) hypertension: Secondary | ICD-10-CM | POA: Diagnosis not present

## 2016-08-08 DIAGNOSIS — I4891 Unspecified atrial fibrillation: Secondary | ICD-10-CM | POA: Diagnosis not present

## 2016-08-08 DIAGNOSIS — L308 Other specified dermatitis: Secondary | ICD-10-CM | POA: Diagnosis not present

## 2016-08-08 DIAGNOSIS — Z1389 Encounter for screening for other disorder: Secondary | ICD-10-CM | POA: Diagnosis not present

## 2016-08-08 DIAGNOSIS — E8881 Metabolic syndrome: Secondary | ICD-10-CM | POA: Diagnosis not present

## 2016-08-23 DIAGNOSIS — N3946 Mixed incontinence: Secondary | ICD-10-CM | POA: Diagnosis not present

## 2016-08-23 DIAGNOSIS — I4891 Unspecified atrial fibrillation: Secondary | ICD-10-CM | POA: Diagnosis not present

## 2016-08-23 DIAGNOSIS — J399 Disease of upper respiratory tract, unspecified: Secondary | ICD-10-CM | POA: Diagnosis not present

## 2016-08-23 DIAGNOSIS — I150 Renovascular hypertension: Secondary | ICD-10-CM | POA: Diagnosis not present

## 2016-08-29 DIAGNOSIS — L578 Other skin changes due to chronic exposure to nonionizing radiation: Secondary | ICD-10-CM | POA: Diagnosis not present

## 2016-08-29 DIAGNOSIS — L570 Actinic keratosis: Secondary | ICD-10-CM | POA: Diagnosis not present

## 2016-08-29 DIAGNOSIS — L98499 Non-pressure chronic ulcer of skin of other sites with unspecified severity: Secondary | ICD-10-CM | POA: Diagnosis not present

## 2016-08-29 DIAGNOSIS — D692 Other nonthrombocytopenic purpura: Secondary | ICD-10-CM | POA: Diagnosis not present

## 2016-09-27 DIAGNOSIS — R5381 Other malaise: Secondary | ICD-10-CM | POA: Diagnosis not present

## 2016-09-27 DIAGNOSIS — I10 Essential (primary) hypertension: Secondary | ICD-10-CM | POA: Diagnosis not present

## 2016-09-27 DIAGNOSIS — E784 Other hyperlipidemia: Secondary | ICD-10-CM | POA: Diagnosis not present

## 2016-10-03 DIAGNOSIS — I4891 Unspecified atrial fibrillation: Secondary | ICD-10-CM | POA: Diagnosis not present

## 2016-10-03 DIAGNOSIS — I10 Essential (primary) hypertension: Secondary | ICD-10-CM | POA: Diagnosis not present

## 2016-10-03 DIAGNOSIS — S20229A Contusion of unspecified back wall of thorax, initial encounter: Secondary | ICD-10-CM | POA: Diagnosis not present

## 2016-10-03 DIAGNOSIS — N3946 Mixed incontinence: Secondary | ICD-10-CM | POA: Diagnosis not present

## 2016-10-21 ENCOUNTER — Other Ambulatory Visit (INDEPENDENT_AMBULATORY_CARE_PROVIDER_SITE_OTHER): Payer: Self-pay | Admitting: Vascular Surgery

## 2016-10-21 DIAGNOSIS — I701 Atherosclerosis of renal artery: Secondary | ICD-10-CM

## 2016-10-24 ENCOUNTER — Ambulatory Visit (INDEPENDENT_AMBULATORY_CARE_PROVIDER_SITE_OTHER): Payer: PPO | Admitting: Vascular Surgery

## 2016-10-24 ENCOUNTER — Encounter (INDEPENDENT_AMBULATORY_CARE_PROVIDER_SITE_OTHER): Payer: Self-pay | Admitting: Vascular Surgery

## 2016-10-24 ENCOUNTER — Ambulatory Visit (INDEPENDENT_AMBULATORY_CARE_PROVIDER_SITE_OTHER): Payer: PPO

## 2016-10-24 DIAGNOSIS — I701 Atherosclerosis of renal artery: Secondary | ICD-10-CM

## 2016-10-24 DIAGNOSIS — I70219 Atherosclerosis of native arteries of extremities with intermittent claudication, unspecified extremity: Secondary | ICD-10-CM | POA: Insufficient documentation

## 2016-10-24 DIAGNOSIS — I10 Essential (primary) hypertension: Secondary | ICD-10-CM | POA: Diagnosis not present

## 2016-10-24 DIAGNOSIS — I25119 Atherosclerotic heart disease of native coronary artery with unspecified angina pectoris: Secondary | ICD-10-CM | POA: Diagnosis not present

## 2016-10-24 DIAGNOSIS — E782 Mixed hyperlipidemia: Secondary | ICD-10-CM | POA: Diagnosis not present

## 2016-10-24 DIAGNOSIS — I70213 Atherosclerosis of native arteries of extremities with intermittent claudication, bilateral legs: Secondary | ICD-10-CM | POA: Diagnosis not present

## 2016-10-24 DIAGNOSIS — I2510 Atherosclerotic heart disease of native coronary artery without angina pectoris: Secondary | ICD-10-CM | POA: Insufficient documentation

## 2016-10-24 DIAGNOSIS — E7800 Pure hypercholesterolemia, unspecified: Secondary | ICD-10-CM | POA: Insufficient documentation

## 2016-10-24 NOTE — Progress Notes (Signed)
MRN : [id number]  Lori Roberts is a 83 y.o. ([birth year]) female who presents with chief complaint of  Chief Complaint  Patient presents with  . ultrasound follow up  .  History of Present Illness: The patient returns to the office for followup and review of the noninvasive studies regarding renal vascular hypertension and renal artery stenosis. There have been no interval changes in the patient's blood pressure control.  He denies any major changes in is medications.  The patient denies headache or flushing.  No flank or unusual back pain.    There have been no significant changes to the patient's overall health care.  No interval shortening of the patient's walking distance or new symptoms consistent with claudication.  The patient denies the  development of rest pain symptoms. No new ulcers or wounds have occurred since the last visit.  The patient denies amaurosis fugax or recent TIA symptoms. There are no recent neurological changes noted. The patient denies history of DVT, PE or superficial thrombophlebitis. The patient denies recent episodes of angina or shortness of breath.   Duplex ultrasound of the renal arteries shows both stents are  patent without evidence of restenosis  Current Meds  Medication Sig  . acetaminophen (TYLENOL) 325 MG tablet Take 1-2 tablets (325-650 mg total) by mouth every 4 (four) hours as needed for mild pain (or temp >/= 101 F).  Marland Kitchen amLODipine (NORVASC) 5 MG tablet Take 5 mg by mouth daily.  Marland Kitchen atorvastatin (LIPITOR) 20 MG tablet Take 20 mg by mouth daily.  . bumetanide (BUMEX) 0.5 MG tablet Take 0.5 mg by mouth daily.  . clopidogrel (PLAVIX) 75 MG tablet Take 75 mg by mouth daily.  . dorzolamide-timolol (COSOPT) 22.3-6.8 MG/ML ophthalmic solution Place 1 drop into both eyes 3 (three) times daily.  . feeding supplement, ENSURE ENLIVE, (ENSURE ENLIVE) LIQD Take 237 mLs by mouth 3 (three) times daily between meals.  . ferrous sulfate 325 (65 FE) MG  tablet Take 1 tablet (325 mg total) by mouth 2 (two) times daily with a meal.  . hydrALAZINE (APRESOLINE) 25 MG tablet Take 25 mg by mouth daily.  . isosorbide dinitrate (ISORDIL) 30 MG tablet Take 30 mg by mouth 3 (three) times daily.  Marland Kitchen loperamide (IMODIUM) 2 MG capsule Take 1 capsule (2 mg total) by mouth as needed for diarrhea or loose stools.  Marland Kitchen olmesartan (BENICAR) 40 MG tablet Take 40 mg by mouth daily.  . solifenacin (VESICARE) 5 MG tablet Take 5 mg by mouth daily.  . sotalol (BETAPACE) 120 MG tablet Take 120 mg by mouth 2 (two) times daily.  . Travoprost, BAK Free, (TRAVATAN) 0.004 % SOLN ophthalmic solution Place 1 drop into both eyes at bedtime.  Marland Kitchen warfarin (COUMADIN) 5 MG tablet Take 1/2 at bedtime. This may change when you follow up with your PCP based on your INR.    Past Medical History:  Diagnosis Date  . Arthritis   . Atrial fibrillation ([place])   . GERD (gastroesophageal reflux disease)   . Glaucoma   . HBP (high blood pressure)   . Heart trouble   . Hyperlipidemia   . PVD (peripheral vascular disease) ([place])     Past Surgical History:  Procedure Laterality Date  . ABDOMINAL HYSTERECTOMY    . APPENDECTOMY    . CEREBRAL ANEURYSM REPAIR    . DILATION AND CURETTAGE OF UTERUS    . FEMORAL-FEMORAL BYPASS GRAFT Bilateral 08/29/2014   Procedure: bilateral aorta iliac thrombectomy, thromboembolectomy, bilateral  femoral thromboembolectomy;  Surgeon: Serafina Mitchell, MD;  Location: [facility] ORS;  Service: Vascular;  Laterality: Bilateral;  . KIDNEY STENT    . TUBAL LIGATION      Social History Social History  Substance Use Topics  . Smoking status: Never Smoker  . Smokeless tobacco: Never Used  . Alcohol use Yes     Comment: WEEKLY    Family History Family History  Problem Relation Age of Onset  . Lung cancer Mother   . Cerebral aneurysm Father     Allergies  Allergen Reactions  . Wool Alcohol [Lanolin] Swelling  . Azor [Amlodipine-Olmesartan] Rash  .  Clindamycin/Lincomycin Rash  . Iodine Rash  . Mebaral [Mephobarbital] Rash  . Penicillins Rash  . Sulfa Antibiotics Rash     REVIEW OF SYSTEMS (Negative unless checked)  Constitutional: [] Weight loss  [] Fever  [] Chills Cardiac: [] Chest pain   [] Chest pressure   [] Palpitations   [] Shortness of breath when laying flat   [] Shortness of breath with exertion. Vascular:  [] Pain in legs with walking   [] Pain in legs at rest  [] History of DVT   [] Phlebitis   [] Swelling in legs   [] Varicose veins   [] Non-healing ulcers Pulmonary:   [] Uses home oxygen   [] Productive cough   [] Hemoptysis   [] Wheeze  [] COPD   [] Asthma Neurologic:  [] Dizziness   [] Seizures   [] History of stroke   [] History of TIA  [] Aphasia   [] Vissual changes   [] Weakness or numbness in arm   [] Weakness or numbness in leg Musculoskeletal:   [] Joint swelling   [] Joint pain   [] Low back pain Hematologic:  [] Easy bruising  [] Easy bleeding   [] Hypercoagulable state   [] Anemic Gastrointestinal:  [] Diarrhea   [] Vomiting  [] Gastroesophageal reflux/heartburn   [] Difficulty swallowing. Genitourinary:  [] Chronic kidney disease   [] Difficult urination  [] Frequent urination   [] Blood in urine Skin:  [] Rashes   [] Ulcers  Psychological:  [] History of anxiety   []  History of major depression.  Physical Examination  Vitals:   10/24/16 0847  BP: (!) 159/78  Pulse: 73  Resp: 16  Weight: 135 lb (61.2 kg)   Body mass index is 22.47 kg/m. Gen: WD/WN, NAD Head: [place]/AT, No temporalis wasting.  Ear/Nose/Throat: Hearing grossly intact, nares w/o erythema or drainage Eyes: PER, EOMI, sclera nonicteric.  Neck: Supple, no large masses.   Pulmonary:  Good air movement, no audible wheezing bilaterally, no use of accessory muscles.  Cardiac: RRR, no JVD Vascular:  Vessel Right Left  Radial Palpable Palpable  Brachial Palpable Palpable  Carotid Palpable Palpable  PT Not Palpable Not Palpable  DP Not Palpable Not Palpable  Gastrointestinal:  Non-distended. No guarding/no peritoneal signs.  Musculoskeletal: M/S 5/5 throughout.  No deformity or atrophy.  Neurologic: CN 2-12 intact. Symmetrical.  Speech is fluent. Motor exam as listed above. Psychiatric: Judgment intact, Mood & affect appropriate for pt's clinical situation. Dermatologic: No rashes or ulcers noted.  No changes consistent with cellulitis. Lymph : No lichenification or skin changes of chronic lymphedema.  CBC Lab Results  Component Value Date   WBC 7.0 09/06/2014   HGB 8.8 (L) 09/06/2014   HCT 27.2 (L) 09/06/2014   MCV 85.2 09/06/2014   PLT 175 09/06/2014    BMET    Component Value Date/Time   NA 137 09/01/2014 0227   NA 142 01/10/2014 1528   K 3.6 09/01/2014 0227   K 3.8 01/10/2014 1528   CL 106 09/01/2014 0227   CL 106 01/10/2014 1528  CO2 26 09/01/2014 0227   CO2 31 01/10/2014 1528   GLUCOSE 123 (H) 09/01/2014 0227   GLUCOSE 144 (H) 01/10/2014 1528   BUN 28 (H) 09/01/2014 0227   BUN 37 (H) 01/10/2014 1528   CREATININE 1.43 (H) 09/01/2014 0227   CREATININE 1.87 (H) 01/10/2014 1528   CALCIUM 8.5 (L) 09/01/2014 0227   CALCIUM 9.0 01/10/2014 1528   GFRNONAA 31 (L) 09/01/2014 0227   GFRNONAA 27 (L) 01/10/2014 1528   GFRAA 36 (L) 09/01/2014 0227   GFRAA 33 (L) 01/10/2014 1528   CrCl cannot be calculated (Patient's most recent lab result is older than the maximum 21 days allowed.).  COAG Lab Results  Component Value Date   INR 2.75 09/06/2014   INR 2.25 09/05/2014   INR 1.61 09/04/2014    Radiology No results found.  Assessment/Plan 1. Renal artery stenosis (HCC) Given patient's arterial disease optimal control of the patient's hypertension is important.  BP is acceptable today  The patient's vital signs and noninvasive studies support the renal artery stenosis is not significantly increased when compared to the previous study.  No invasive studies or intervention is indicated at this time.  The patient will continue the current  antihypertensive medications, no changes at this time.  The primary medical service will continue aggressive antihypertensive therapy as per the AHA guidelines   Given her age and the fact that the stents have been patent for many years I have asked that she follow up with me on a when necessary basis and/or at the request of her primary care  2. Atherosclerosis of native artery of both lower extremities with intermittent claudication (HCC)  Recommend:  The patient has evidence of atherosclerosis of the lower extremities with claudication.  The patient does not voice lifestyle limiting changes at this point in time.  Noninvasive studies do not suggest clinically significant change.  No invasive studies, angiography or surgery at this time The patient should continue walking and begin a more formal exercise program.  The patient should continue antiplatelet therapy and aggressive treatment of the lipid abnormalities  No changes in the patient's medications at this time  The patient should continue wearing graduated compression socks 10-15 mmHg strength to control the mild edema.    3. Coronary artery disease involving native coronary artery of native heart with angina pectoris ([place]) Continue cardiac and antihypertensive medications as already ordered and reviewed, no changes at this time.  Continue statin as ordered and reviewed, no changes at this time  Nitrates PRN for chest pain   4. Essential hypertension Continue antihypertensive medications as already ordered, these medications have been reviewed and there are no changes at this time.   5. Mixed hyperlipidemia Continue statin as ordered and reviewed, no changes at this time    Hortencia Pilar, MD  10/24/2016 10:27 AM

## 2016-11-07 DIAGNOSIS — I359 Nonrheumatic aortic valve disorder, unspecified: Secondary | ICD-10-CM | POA: Diagnosis not present

## 2016-11-07 DIAGNOSIS — N183 Chronic kidney disease, stage 3 (moderate): Secondary | ICD-10-CM | POA: Diagnosis not present

## 2016-11-07 DIAGNOSIS — I4891 Unspecified atrial fibrillation: Secondary | ICD-10-CM | POA: Diagnosis not present

## 2016-11-07 DIAGNOSIS — E8881 Metabolic syndrome: Secondary | ICD-10-CM | POA: Diagnosis not present

## 2016-11-14 ENCOUNTER — Ambulatory Visit (INDEPENDENT_AMBULATORY_CARE_PROVIDER_SITE_OTHER): Payer: PPO | Admitting: Podiatry

## 2016-11-14 DIAGNOSIS — I739 Peripheral vascular disease, unspecified: Secondary | ICD-10-CM | POA: Insufficient documentation

## 2016-11-14 DIAGNOSIS — I471 Supraventricular tachycardia, unspecified: Secondary | ICD-10-CM | POA: Insufficient documentation

## 2016-11-14 DIAGNOSIS — B351 Tinea unguium: Secondary | ICD-10-CM

## 2016-11-14 DIAGNOSIS — M79609 Pain in unspecified limb: Secondary | ICD-10-CM

## 2016-11-14 DIAGNOSIS — I4891 Unspecified atrial fibrillation: Secondary | ICD-10-CM | POA: Insufficient documentation

## 2016-11-14 DIAGNOSIS — I209 Angina pectoris, unspecified: Secondary | ICD-10-CM | POA: Insufficient documentation

## 2016-11-14 NOTE — Progress Notes (Signed)
Complaint:  Visit Type: Patient returns to my office for continued preventative foot care services. Complaint: Patient states" my nails have grown long and thick and become painful to walk and wear shoes"  The patient presents for preventative foot care services. No changes to ROS  Podiatric Exam: Vascular: dorsalis pedis and posterior tibial pulses are palpable bilateral. Capillary return is immediate. Temperature gradient is WNL. Skin turgor WNL  Sensorium: Dimished  Semmes Weinstein monofilament test. Normal tactile sensation bilaterally. Nail Exam: Pt has thick disfigured discolored nails with subungual debris noted bilateral entire nail hallux through fifth toenails Ulcer Exam: There is no evidence of ulcer or pre-ulcerative changes or infection. Orthopedic Exam: Muscle tone and strength are WNL. No limitations in general ROM. No crepitus or effusions noted. Foot type and digits show no abnormalities. Bony prominences are unremarkable. Skin: No Porokeratosis. No infection or ulcers  Diagnosis:  Onychomycosis, , Pain in right toe, pain in left toes  Treatment & Plan Procedures and Treatment: Consent by patient was obtained for treatment procedures. The patient understood the discussion of treatment and procedures well. All questions were answered thoroughly reviewed. Debridement of mycotic and hypertrophic toenails, 1 through 5 bilateral and clearing of subungual debris. No ulceration, no infection noted. Patient has healing ulcer right lateral malleoli. Return Visit-Office Procedure: Patient instructed to return to the office for a follow up visit 3 months for continued evaluation and treatment.    Gardiner Barefoot DPM

## 2016-12-08 DIAGNOSIS — N184 Chronic kidney disease, stage 4 (severe): Secondary | ICD-10-CM | POA: Diagnosis not present

## 2016-12-08 DIAGNOSIS — I10 Essential (primary) hypertension: Secondary | ICD-10-CM | POA: Diagnosis not present

## 2016-12-08 DIAGNOSIS — N390 Urinary tract infection, site not specified: Secondary | ICD-10-CM | POA: Diagnosis not present

## 2016-12-15 DIAGNOSIS — N183 Chronic kidney disease, stage 3 (moderate): Secondary | ICD-10-CM | POA: Diagnosis not present

## 2016-12-15 DIAGNOSIS — I150 Renovascular hypertension: Secondary | ICD-10-CM | POA: Diagnosis not present

## 2016-12-15 DIAGNOSIS — Z23 Encounter for immunization: Secondary | ICD-10-CM | POA: Diagnosis not present

## 2016-12-15 DIAGNOSIS — K219 Gastro-esophageal reflux disease without esophagitis: Secondary | ICD-10-CM | POA: Diagnosis not present

## 2016-12-15 DIAGNOSIS — I4891 Unspecified atrial fibrillation: Secondary | ICD-10-CM | POA: Diagnosis not present

## 2017-01-04 DIAGNOSIS — H401132 Primary open-angle glaucoma, bilateral, moderate stage: Secondary | ICD-10-CM | POA: Diagnosis not present

## 2017-01-13 DIAGNOSIS — I4891 Unspecified atrial fibrillation: Secondary | ICD-10-CM | POA: Diagnosis not present

## 2017-01-13 DIAGNOSIS — N183 Chronic kidney disease, stage 3 (moderate): Secondary | ICD-10-CM | POA: Diagnosis not present

## 2017-01-13 DIAGNOSIS — I150 Renovascular hypertension: Secondary | ICD-10-CM | POA: Diagnosis not present

## 2017-01-13 DIAGNOSIS — R2681 Unsteadiness on feet: Secondary | ICD-10-CM | POA: Diagnosis not present

## 2017-01-15 ENCOUNTER — Emergency Department
Admission: EM | Admit: 2017-01-15 | Discharge: 2017-01-15 | Disposition: A | Discharge status: Home / Self Care | Payer: PPO | Attending: Emergency Medicine | Admitting: Emergency Medicine

## 2017-01-15 ENCOUNTER — Encounter: Payer: Self-pay | Admitting: Intensive Care

## 2017-01-15 DIAGNOSIS — Z7901 Long term (current) use of anticoagulants: Secondary | ICD-10-CM | POA: Diagnosis not present

## 2017-01-15 DIAGNOSIS — Z7902 Long term (current) use of antithrombotics/antiplatelets: Secondary | ICD-10-CM | POA: Insufficient documentation

## 2017-01-15 DIAGNOSIS — I10 Essential (primary) hypertension: Secondary | ICD-10-CM | POA: Insufficient documentation

## 2017-01-15 DIAGNOSIS — I2510 Atherosclerotic heart disease of native coronary artery without angina pectoris: Secondary | ICD-10-CM | POA: Diagnosis not present

## 2017-01-15 DIAGNOSIS — T7840XA Allergy, unspecified, initial encounter: Secondary | ICD-10-CM | POA: Diagnosis not present

## 2017-01-15 DIAGNOSIS — Z79899 Other long term (current) drug therapy: Secondary | ICD-10-CM | POA: Diagnosis not present

## 2017-01-15 DIAGNOSIS — R21 Rash and other nonspecific skin eruption: Secondary | ICD-10-CM | POA: Diagnosis not present

## 2017-01-15 DIAGNOSIS — L299 Pruritus, unspecified: Secondary | ICD-10-CM | POA: Diagnosis not present

## 2017-01-15 LAB — BASIC METABOLIC PANEL
ANION GAP: 7 (ref 5–15)
BUN: 32 mg/dL — ABNORMAL HIGH (ref 6–20)
CHLORIDE: 106 mmol/L (ref 101–111)
CO2: 29 mmol/L (ref 22–32)
Calcium: 9.8 mg/dL (ref 8.9–10.3)
Creatinine, Ser: 1.32 mg/dL — ABNORMAL HIGH (ref 0.44–1.00)
GFR calc non Af Amer: 34 mL/min — ABNORMAL LOW (ref >60)
GFR, EST AFRICAN AMERICAN: 39 mL/min — AB (ref >60)
Glucose, Bld: 87 mg/dL (ref 65–99)
Potassium: 3.6 mmol/L (ref 3.5–5.1)
SODIUM: 142 mmol/L (ref 135–145)

## 2017-01-15 LAB — CBC
HEMATOCRIT: 37.5 % (ref 35.0–47.0)
HEMOGLOBIN: 12.4 g/dL (ref 12.0–16.0)
MCH: 28.9 pg (ref 26.0–34.0)
MCHC: 33.1 g/dL (ref 32.0–36.0)
MCV: 87.4 fL (ref 80.0–100.0)
Platelets: 158 10*3/uL (ref 150–440)
RBC: 4.29 MIL/uL (ref 3.80–5.20)
RDW: 16.4 % — ABNORMAL HIGH (ref 11.5–14.5)
WBC: 5 10*3/uL (ref 3.6–11.0)

## 2017-01-15 LAB — PROTIME-INR
INR: 1.45
PROTHROMBIN TIME: 17.5 s — AB (ref 11.4–15.2)

## 2017-01-15 MED ORDER — PREDNISONE 10 MG PO TABS: 10.0000 mg | ORAL_TABLET | Freq: Two times a day (BID) | ORAL | 0 refills | Status: DC

## 2017-01-15 MED ORDER — PREDNISONE 10 MG PO TABS
10.0000 mg | ORAL_TABLET | Freq: Once | ORAL | Status: AC
  Administered 2017-01-15: 10 mg via ORAL
  Filled 2017-01-15: qty 1

## 2017-01-15 NOTE — ED Triage Notes (Addendum)
EMS pt ([place]) from home , with increased skin spots , ( raspberry color) with itchiness , is on coumadin , A&O x4 ,  EMS reports " 168/84, HR 84, RR 18, 97% RA, did not take any medication this morning

## 2017-01-15 NOTE — ED Provider Notes (Signed)
[facility] Emergency Department Provider Note  Time seen: 9:28 AM  I have reviewed the triage vital signs and the nursing notes.   HISTORY  Chief Complaint Rash    HPI Lori Roberts is a 83 y.o. female with a past medical history of atrial fibrillation on Coumadin, gastric reflux, hyperlipidemia, presents to the emergency department for an itchy rash.  According to the patient 2 days ago she was seen by her primary care doctor for a routine follow-up.  At that time she noticed several red areas to her chest and upper extremities.  States over the past 2 days they have become more numerous.  Patient states they are very itchy.  Denies any trouble breathing or wheeze.  Denies any fever or illness.  Denies any new medications or exposures.  Past Medical History:  Diagnosis Date  . Arthritis   . Atrial fibrillation ([place])   . GERD (gastroesophageal reflux disease)   . Glaucoma   . HBP (high blood pressure)   . Heart trouble   . Hyperlipidemia   . PVD (peripheral vascular disease) [facility])     Patient Active Problem List   Diagnosis Date Noted  . Afib ([place]) 11/14/2016  . Angina pectoris ([place]) 11/14/2016  . PVD (peripheral vascular disease) ([place]) 11/14/2016  . SVT (supraventricular tachycardia) ([place]) 11/14/2016  . Renal artery stenosis ([place]) 10/24/2016  . Atherosclerosis of native arteries of extremity with intermittent claudication ([place]) 10/24/2016  . CAD (coronary artery disease) 10/24/2016  . HTN (hypertension) 10/24/2016  . Hypercholesteremia 10/24/2016  . Aortic occlusion ([place]) 08/30/2014  . CRI (chronic renal insufficiency) 07/11/2013  . Murmur, cardiac 07/11/2013  . SOB (shortness of breath) 07/11/2013    Past Surgical History:  Procedure Laterality Date  . ABDOMINAL HYSTERECTOMY    . APPENDECTOMY    . CEREBRAL ANEURYSM REPAIR    . DILATION AND CURETTAGE OF UTERUS    . FEMORAL-FEMORAL BYPASS GRAFT Bilateral 08/29/2014   Procedure: bilateral  aorta iliac thrombectomy, thromboembolectomy, bilateral femoral thromboembolectomy;  Surgeon: Serafina Mitchell, MD;  Location: [facility] ORS;  Service: Vascular;  Laterality: Bilateral;  . KIDNEY STENT    . TUBAL LIGATION      Prior to Admission medications   Medication Sig Start Date End Date Taking? Authorizing Provider  acetaminophen (TYLENOL) 325 MG tablet Take 1-2 tablets (325-650 mg total) by mouth every 4 (four) hours as needed for mild pain (or temp >/= 101 F). 09/06/14   Theodoro Grist, MD  amLODipine (NORVASC) 5 MG tablet Take 5 mg by mouth daily.    [provider]  atorvastatin (LIPITOR) 20 MG tablet Take 20 mg by mouth daily.    [provider]  bumetanide (BUMEX) 0.5 MG tablet Take 0.5 mg by mouth daily.    [provider]  clopidogrel (PLAVIX) 75 MG tablet Take 75 mg by mouth daily.    [provider]  dorzolamide-timolol (COSOPT) 22.3-6.8 MG/ML ophthalmic solution Place 1 drop into both eyes 3 (three) times daily.    [provider]  feeding supplement, ENSURE ENLIVE, (ENSURE ENLIVE) LIQD Take 237 mLs by mouth 3 (three) times daily between meals. 09/06/14   Theodoro Grist, MD  ferrous sulfate 325 (65 FE) MG tablet Take 1 tablet (325 mg total) by mouth 2 (two) times daily with a meal. 09/06/14   Theodoro Grist, MD  hydrALAZINE (APRESOLINE) 25 MG tablet Take 25 mg by mouth daily.    [provider]  isosorbide dinitrate (ISORDIL) 30 MG  tablet Take 30 mg by mouth 3 (three) times daily.    [provider]  loperamide (IMODIUM) 2 MG capsule Take 1 capsule (2 mg total) by mouth as needed for diarrhea or loose stools. 08/17/14   Earleen Newport, MD  olmesartan (BENICAR) 40 MG tablet Take 40 mg by mouth daily.    [provider]  pantoprazole (PROTONIX) 40 MG tablet Take 1 tablet (40 mg total) by mouth daily. 08/17/14 08/17/15  Earleen Newport, MD  solifenacin (VESICARE) 5 MG tablet Take 5 mg by mouth daily.     [provider]  sotalol (BETAPACE) 120 MG tablet Take 120 mg by mouth 2 (two) times daily.    [provider]  Travoprost, BAK Free, (TRAVATAN) 0.004 % SOLN ophthalmic solution Place 1 drop into both eyes at bedtime.    [provider]  warfarin (COUMADIN) 5 MG tablet Take 1/2 at bedtime. This may change when you follow up with your PCP based on your INR. 09/06/14   Theodoro Grist, MD    Allergies  Allergen Reactions  . Mebaral [Mephobarbital] Rash and Shortness Of Breath  . Wool Alcohol [Lanolin] Swelling  . Azor [Amlodipine-Olmesartan] Rash  . Clindamycin/Lincomycin Rash  . Iodine Rash  . Penicillins Rash  . Sulfa Antibiotics Rash  . Sulfamethoxazole-Trimethoprim Rash    Family History  Problem Relation Age of Onset  . Lung cancer Mother   . Cerebral aneurysm Father     Social History Social History  Substance Use Topics  . Smoking status: Never Smoker  . Smokeless tobacco: Never Used  . Alcohol use No    Review of Systems Constitutional: Negative for fever. Cardiovascular: Negative for chest pain. Respiratory: Negative for shortness of breath. Gastrointestinal: Negative for abdominal pain, vomiting Skin: Red rash to upper extremities and chest, somewhat itchy Neurological: Negative for headache All other ROS negative  ____________________________________________   PHYSICAL EXAM:  VITAL SIGNS: ED Triage Vitals [01/15/17 0926]  Enc Vitals Group     BP (!) 171/89     Pulse Rate 68     Resp 18     Temp 98.2 F (36.8 C)     Temp Source Oral     SpO2 100 %     Weight 135 lb (61.2 kg)     Height 5' 5.5" (1.664 m)     Head Circumference      Peak Flow      Pain Score      Pain Loc      Pain Edu?      Excl. in [place]?     Constitutional: Alert and oriented. Well appearing and in no distress. Eyes: Normal exam ENT   Head: Normocephalic and atraumatic.   Mouth/Throat: Mucous membranes are moist. Cardiovascular: Normal rate,  regular rhythm.  Respiratory: Normal respiratory effort without tachypnea nor retractions. Breath sounds are clear  Gastrointestinal: Soft and nontender. No distention. Musculoskeletal: Nontender with normal range of motion in all extremities.  Neurologic:  Normal speech and language. No gross focal neurologic deficits Skin:  Skin is warm, dry.  Patient does have several erythematous lesions to the chest and upper extremities.  Nonblanching.  Nothing on the abdomen back, face, or lower extremities.  Most consistent with purpura. Psychiatric: Mood and affect are normal.   ____________________________________________    INITIAL IMPRESSION / ASSESSMENT AND PLAN / ED COURSE  Pertinent labs & imaging results that were available during my care of the patient were reviewed by me and considered  in my medical decision making (see chart for details).  Patient with rash to chest and upper extremities.  Differential would include purpura, vasculitis, allergic reaction, elevated INR, thrombocytopenia, infectious etiology.  Overall the patient appears very well, no distress, calm and cooperative.  Patient denies any fever or recent illness.  We will check labs including a CBC and BMP.  We will obtain an INR to evaluate her warfarin status.  We will continue to closely monitor in the emergency department.  Patient's labs are largely within normal limits subtherapeutic INR, normal platelet count.  We will place the patient on a low-dose short course of prednisone and have her follow-up with her primary care doctor in the next 1-2 days for recheck.  The patient is agreeable to this plan.  ____________________________________________   FINAL CLINICAL IMPRESSION(S) / ED DIAGNOSES  Rash    Harvest Dark, MD 01/15/17 1047

## 2017-01-15 NOTE — Discharge Instructions (Signed)
Please take your steroids as prescribed.  Please follow-up with your primary care doctor on Monday for recheck/reevaluation.  Return to the emergency department for any fever, or any other symptom personally concerning to yourself.

## 2017-01-16 DIAGNOSIS — N3946 Mixed incontinence: Secondary | ICD-10-CM | POA: Diagnosis not present

## 2017-01-16 DIAGNOSIS — R2681 Unsteadiness on feet: Secondary | ICD-10-CM | POA: Diagnosis not present

## 2017-01-16 DIAGNOSIS — D692 Other nonthrombocytopenic purpura: Secondary | ICD-10-CM | POA: Diagnosis not present

## 2017-01-16 DIAGNOSIS — I4891 Unspecified atrial fibrillation: Secondary | ICD-10-CM | POA: Diagnosis not present

## 2017-02-13 DIAGNOSIS — L309 Dermatitis, unspecified: Secondary | ICD-10-CM | POA: Diagnosis not present

## 2017-02-13 DIAGNOSIS — D692 Other nonthrombocytopenic purpura: Secondary | ICD-10-CM | POA: Diagnosis not present

## 2017-02-13 DIAGNOSIS — I10 Essential (primary) hypertension: Secondary | ICD-10-CM | POA: Diagnosis not present

## 2017-02-13 DIAGNOSIS — I4891 Unspecified atrial fibrillation: Secondary | ICD-10-CM | POA: Diagnosis not present

## 2017-02-16 ENCOUNTER — Ambulatory Visit (INDEPENDENT_AMBULATORY_CARE_PROVIDER_SITE_OTHER): Payer: PPO | Admitting: Podiatry

## 2017-02-16 ENCOUNTER — Encounter: Payer: Self-pay | Admitting: Podiatry

## 2017-02-16 DIAGNOSIS — M79609 Pain in unspecified limb: Secondary | ICD-10-CM | POA: Diagnosis not present

## 2017-02-16 DIAGNOSIS — M79676 Pain in unspecified toe(s): Secondary | ICD-10-CM

## 2017-02-16 DIAGNOSIS — B351 Tinea unguium: Secondary | ICD-10-CM

## 2017-02-16 NOTE — Progress Notes (Signed)
Complaint:  Visit Type: Patient returns to my office for continued preventative foot care services. Complaint: Patient states" my nails have grown long and thick and become painful to walk and wear shoes"  The patient presents for preventative foot care services. No changes to ROS  Podiatric Exam: Vascular: dorsalis pedis and posterior tibial pulses are palpable bilateral. Capillary return is immediate. Temperature gradient is WNL. Skin turgor WNL  Sensorium: Dimished  Semmes Weinstein monofilament test. Normal tactile sensation bilaterally. Nail Exam: Pt has thick disfigured discolored nails with subungual debris noted bilateral entire nail hallux through fifth toenails Ulcer Exam: There is no evidence of ulcer or pre-ulcerative changes or infection. Orthopedic Exam: Muscle tone and strength are WNL. No limitations in general ROM. No crepitus or effusions noted. Foot type and digits show no abnormalities. Bony prominences are unremarkable. Skin: No Porokeratosis. No infection or ulcers  Diagnosis:  Onychomycosis, , Pain in right toe, pain in left toes  Treatment & Plan Procedures and Treatment: Consent by patient was obtained for treatment procedures. The patient understood the discussion of treatment and procedures well. All questions were answered thoroughly reviewed. Debridement of mycotic and hypertrophic toenails, 1 through 5 bilateral and clearing of subungual debris. No ulceration, no infection noted. Patient has ulcer right lateral malleoli. Return Visit-Office Procedure: Patient instructed to return to the office for a follow up visit 3 months for continued evaluation and treatment.    Gardiner Barefoot DPM

## 2017-03-09 DIAGNOSIS — I4891 Unspecified atrial fibrillation: Secondary | ICD-10-CM | POA: Diagnosis not present

## 2017-03-09 DIAGNOSIS — R2681 Unsteadiness on feet: Secondary | ICD-10-CM | POA: Diagnosis not present

## 2017-03-09 DIAGNOSIS — D692 Other nonthrombocytopenic purpura: Secondary | ICD-10-CM | POA: Diagnosis not present

## 2017-03-09 DIAGNOSIS — L309 Dermatitis, unspecified: Secondary | ICD-10-CM | POA: Diagnosis not present

## 2017-03-09 DIAGNOSIS — I10 Essential (primary) hypertension: Secondary | ICD-10-CM | POA: Diagnosis not present

## 2017-04-03 DIAGNOSIS — R2681 Unsteadiness on feet: Secondary | ICD-10-CM | POA: Diagnosis not present

## 2017-04-03 DIAGNOSIS — I4891 Unspecified atrial fibrillation: Secondary | ICD-10-CM | POA: Diagnosis not present

## 2017-04-03 DIAGNOSIS — L308 Other specified dermatitis: Secondary | ICD-10-CM | POA: Diagnosis not present

## 2017-04-03 DIAGNOSIS — I359 Nonrheumatic aortic valve disorder, unspecified: Secondary | ICD-10-CM | POA: Diagnosis not present

## 2017-04-03 DIAGNOSIS — D692 Other nonthrombocytopenic purpura: Secondary | ICD-10-CM | POA: Diagnosis not present

## 2017-04-04 DIAGNOSIS — I359 Nonrheumatic aortic valve disorder, unspecified: Secondary | ICD-10-CM | POA: Diagnosis not present

## 2017-04-04 DIAGNOSIS — L308 Other specified dermatitis: Secondary | ICD-10-CM | POA: Diagnosis not present

## 2017-04-04 DIAGNOSIS — I4891 Unspecified atrial fibrillation: Secondary | ICD-10-CM | POA: Diagnosis not present

## 2017-04-04 DIAGNOSIS — I130 Hypertensive heart and chronic kidney disease with heart failure and stage 1 through stage 4 chronic kidney disease, or unspecified chronic kidney disease: Secondary | ICD-10-CM | POA: Diagnosis not present

## 2017-04-05 DIAGNOSIS — I4891 Unspecified atrial fibrillation: Secondary | ICD-10-CM | POA: Diagnosis not present

## 2017-04-05 DIAGNOSIS — M545 Low back pain: Secondary | ICD-10-CM | POA: Diagnosis not present

## 2017-04-05 DIAGNOSIS — L509 Urticaria, unspecified: Secondary | ICD-10-CM | POA: Diagnosis not present

## 2017-04-05 DIAGNOSIS — E669 Obesity, unspecified: Secondary | ICD-10-CM | POA: Diagnosis not present

## 2017-04-10 DIAGNOSIS — I150 Renovascular hypertension: Secondary | ICD-10-CM | POA: Diagnosis not present

## 2017-04-10 DIAGNOSIS — N183 Chronic kidney disease, stage 3 (moderate): Secondary | ICD-10-CM | POA: Diagnosis not present

## 2017-04-10 DIAGNOSIS — D692 Other nonthrombocytopenic purpura: Secondary | ICD-10-CM | POA: Diagnosis not present

## 2017-04-10 DIAGNOSIS — L309 Dermatitis, unspecified: Secondary | ICD-10-CM | POA: Diagnosis not present

## 2017-04-17 DIAGNOSIS — R2681 Unsteadiness on feet: Secondary | ICD-10-CM | POA: Diagnosis not present

## 2017-04-17 DIAGNOSIS — I4891 Unspecified atrial fibrillation: Secondary | ICD-10-CM | POA: Diagnosis not present

## 2017-04-17 DIAGNOSIS — I359 Nonrheumatic aortic valve disorder, unspecified: Secondary | ICD-10-CM | POA: Diagnosis not present

## 2017-04-17 DIAGNOSIS — L308 Other specified dermatitis: Secondary | ICD-10-CM | POA: Diagnosis not present

## 2017-05-01 DIAGNOSIS — R2681 Unsteadiness on feet: Secondary | ICD-10-CM | POA: Diagnosis not present

## 2017-05-01 DIAGNOSIS — I359 Nonrheumatic aortic valve disorder, unspecified: Secondary | ICD-10-CM | POA: Diagnosis not present

## 2017-05-01 DIAGNOSIS — L308 Other specified dermatitis: Secondary | ICD-10-CM | POA: Diagnosis not present

## 2017-05-01 DIAGNOSIS — I4891 Unspecified atrial fibrillation: Secondary | ICD-10-CM | POA: Diagnosis not present

## 2017-05-18 ENCOUNTER — Encounter: Payer: Self-pay | Admitting: Podiatry

## 2017-05-18 ENCOUNTER — Ambulatory Visit (INDEPENDENT_AMBULATORY_CARE_PROVIDER_SITE_OTHER): Payer: PPO | Admitting: Podiatry

## 2017-05-18 DIAGNOSIS — M79676 Pain in unspecified toe(s): Secondary | ICD-10-CM | POA: Diagnosis not present

## 2017-05-18 DIAGNOSIS — L84 Corns and callosities: Secondary | ICD-10-CM | POA: Diagnosis not present

## 2017-05-18 DIAGNOSIS — M79609 Pain in unspecified limb: Principal | ICD-10-CM

## 2017-05-18 DIAGNOSIS — B351 Tinea unguium: Secondary | ICD-10-CM

## 2017-05-18 NOTE — Progress Notes (Signed)
Complaint:  Visit Type: Patient returns to my office for continued preventative foot care services. Complaint: Patient states" my nails have grown long and thick and become painful to walk and wear shoes"  The patient presents for preventative foot care services. No changes to ROS  Podiatric Exam: Vascular: dorsalis pedis and posterior tibial pulses are palpable bilateral. Capillary return is immediate. Temperature gradient is WNL. Skin turgor WNL  Sensorium: Dimished  Semmes Weinstein monofilament test. Normal tactile sensation bilaterally. Nail Exam: Pt has thick disfigured discolored nails with subungual debris noted bilateral entire nail hallux through fifth toenails Ulcer Exam: There is no evidence of ulcer or pre-ulcerative changes or infection. Orthopedic Exam: Muscle tone and strength are WNL. No limitations in general ROM. No crepitus or effusions noted. Foot type and digits show no abnormalities. Bony prominences are unremarkable. Skin: No Porokeratosis. No infection or ulcers.  Digital clavi third toe right foot.  Diagnosis:  Onychomycosis, , Pain in right toe, pain in left toes Digital Clavi  Treatment & Plan Procedures and Treatment: Consent by patient was obtained for treatment procedures. The patient understood the discussion of treatment and procedures well. All questions were answered thoroughly reviewed. Debridement of mycotic and hypertrophic toenails, 1 through 5 bilateral and clearing of subungual debris. No ulceration, no infection noted.  Debride clavi. Patient has ulcer right lateral malleoli. Told to use heel pillow. Return Visit-Office Procedure: Patient instructed to return to the office for a follow up visit 3 months for continued evaluation and treatment.    Gardiner Barefoot DPM

## 2017-05-23 DIAGNOSIS — D485 Neoplasm of uncertain behavior of skin: Secondary | ICD-10-CM | POA: Diagnosis not present

## 2017-05-23 DIAGNOSIS — C44629 Squamous cell carcinoma of skin of left upper limb, including shoulder: Secondary | ICD-10-CM | POA: Diagnosis not present

## 2017-05-23 DIAGNOSIS — L578 Other skin changes due to chronic exposure to nonionizing radiation: Secondary | ICD-10-CM | POA: Diagnosis not present

## 2017-05-29 DIAGNOSIS — R2681 Unsteadiness on feet: Secondary | ICD-10-CM | POA: Diagnosis not present

## 2017-05-29 DIAGNOSIS — I4891 Unspecified atrial fibrillation: Secondary | ICD-10-CM | POA: Diagnosis not present

## 2017-05-29 DIAGNOSIS — D692 Other nonthrombocytopenic purpura: Secondary | ICD-10-CM | POA: Diagnosis not present

## 2017-05-29 DIAGNOSIS — I150 Renovascular hypertension: Secondary | ICD-10-CM | POA: Diagnosis not present

## 2017-06-12 DIAGNOSIS — R600 Localized edema: Secondary | ICD-10-CM | POA: Diagnosis not present

## 2017-06-12 DIAGNOSIS — I10 Essential (primary) hypertension: Secondary | ICD-10-CM | POA: Diagnosis not present

## 2017-06-12 DIAGNOSIS — N184 Chronic kidney disease, stage 4 (severe): Secondary | ICD-10-CM | POA: Diagnosis not present

## 2017-06-27 DIAGNOSIS — I10 Essential (primary) hypertension: Secondary | ICD-10-CM | POA: Diagnosis not present

## 2017-06-27 DIAGNOSIS — Z888 Allergy status to other drugs, medicaments and biological substances status: Secondary | ICD-10-CM | POA: Diagnosis not present

## 2017-06-27 DIAGNOSIS — I4891 Unspecified atrial fibrillation: Secondary | ICD-10-CM | POA: Diagnosis not present

## 2017-06-27 DIAGNOSIS — R2681 Unsteadiness on feet: Secondary | ICD-10-CM | POA: Diagnosis not present

## 2017-07-10 DIAGNOSIS — H401132 Primary open-angle glaucoma, bilateral, moderate stage: Secondary | ICD-10-CM | POA: Diagnosis not present

## 2017-07-25 DIAGNOSIS — I4891 Unspecified atrial fibrillation: Secondary | ICD-10-CM | POA: Diagnosis not present

## 2017-07-25 DIAGNOSIS — D692 Other nonthrombocytopenic purpura: Secondary | ICD-10-CM | POA: Diagnosis not present

## 2017-07-25 DIAGNOSIS — I10 Essential (primary) hypertension: Secondary | ICD-10-CM | POA: Diagnosis not present

## 2017-07-25 DIAGNOSIS — R2681 Unsteadiness on feet: Secondary | ICD-10-CM | POA: Diagnosis not present

## 2017-08-17 ENCOUNTER — Ambulatory Visit (INDEPENDENT_AMBULATORY_CARE_PROVIDER_SITE_OTHER): Payer: PPO | Admitting: Podiatry

## 2017-08-17 ENCOUNTER — Encounter: Payer: Self-pay | Admitting: Podiatry

## 2017-08-17 DIAGNOSIS — B351 Tinea unguium: Secondary | ICD-10-CM | POA: Diagnosis not present

## 2017-08-17 DIAGNOSIS — D689 Coagulation defect, unspecified: Secondary | ICD-10-CM | POA: Diagnosis not present

## 2017-08-17 DIAGNOSIS — M79609 Pain in unspecified limb: Secondary | ICD-10-CM

## 2017-08-17 DIAGNOSIS — I739 Peripheral vascular disease, unspecified: Secondary | ICD-10-CM

## 2017-08-17 NOTE — Progress Notes (Signed)
Complaint:  Visit Type: Patient returns to my office for continued preventative foot care services. Complaint: Patient states" my nails have grown long and thick and become painful to walk and wear shoes"  The patient presents for preventative foot care services. No changes to ROS  Podiatric Exam: Vascular: dorsalis pedis and posterior tibial pulses are palpable bilateral. Capillary return is immediate. Temperature gradient is WNL. Skin turgor WNL  Sensorium: Dimished  Semmes Weinstein monofilament test. Normal tactile sensation bilaterally. Nail Exam: Pt has thick disfigured discolored nails with subungual debris noted bilateral entire nail hallux through fifth toenails Ulcer Exam: There is no evidence of ulcer or pre-ulcerative changes or infection. Orthopedic Exam: Muscle tone and strength are WNL. No limitations in general ROM. No crepitus or effusions noted. Foot type and digits show no abnormalities. Bony prominences are unremarkable. Skin: No Porokeratosis. No infection or ulcers.  Digital clavi third toe right foot.  Diagnosis:  Onychomycosis, , Pain in right toe, pain in left toes Digital Clavi  Treatment & Plan Procedures and Treatment: Consent by patient was obtained for treatment procedures. The patient understood the discussion of treatment and procedures well. All questions were answered thoroughly reviewed. Debridement of mycotic and hypertrophic toenails, 1 through 5 bilateral and clearing of subungual debris. No ulceration, no infection noted.   Return Visit-Office Procedure: Patient instructed to return to the office for a follow up visit 3 months for continued evaluation and treatment.    Gardiner Barefoot DPM

## 2017-08-23 DIAGNOSIS — L570 Actinic keratosis: Secondary | ICD-10-CM | POA: Diagnosis not present

## 2017-08-23 DIAGNOSIS — L821 Other seborrheic keratosis: Secondary | ICD-10-CM | POA: Diagnosis not present

## 2017-08-23 DIAGNOSIS — D692 Other nonthrombocytopenic purpura: Secondary | ICD-10-CM | POA: Diagnosis not present

## 2017-08-23 DIAGNOSIS — L578 Other skin changes due to chronic exposure to nonionizing radiation: Secondary | ICD-10-CM | POA: Diagnosis not present

## 2017-08-23 DIAGNOSIS — Z85828 Personal history of other malignant neoplasm of skin: Secondary | ICD-10-CM | POA: Diagnosis not present

## 2017-08-25 DIAGNOSIS — R2681 Unsteadiness on feet: Secondary | ICD-10-CM | POA: Diagnosis not present

## 2017-08-25 DIAGNOSIS — D692 Other nonthrombocytopenic purpura: Secondary | ICD-10-CM | POA: Diagnosis not present

## 2017-08-25 DIAGNOSIS — I10 Essential (primary) hypertension: Secondary | ICD-10-CM | POA: Diagnosis not present

## 2017-08-25 DIAGNOSIS — I4891 Unspecified atrial fibrillation: Secondary | ICD-10-CM | POA: Diagnosis not present

## 2017-08-25 DIAGNOSIS — L309 Dermatitis, unspecified: Secondary | ICD-10-CM | POA: Diagnosis not present

## 2017-09-26 DIAGNOSIS — R2681 Unsteadiness on feet: Secondary | ICD-10-CM | POA: Diagnosis not present

## 2017-09-26 DIAGNOSIS — I130 Hypertensive heart and chronic kidney disease with heart failure and stage 1 through stage 4 chronic kidney disease, or unspecified chronic kidney disease: Secondary | ICD-10-CM | POA: Diagnosis not present

## 2017-09-26 DIAGNOSIS — D692 Other nonthrombocytopenic purpura: Secondary | ICD-10-CM | POA: Diagnosis not present

## 2017-09-26 DIAGNOSIS — I4891 Unspecified atrial fibrillation: Secondary | ICD-10-CM | POA: Diagnosis not present

## 2017-11-16 ENCOUNTER — Ambulatory Visit (INDEPENDENT_AMBULATORY_CARE_PROVIDER_SITE_OTHER): Payer: PPO | Admitting: Podiatry

## 2017-11-16 ENCOUNTER — Encounter: Payer: Self-pay | Admitting: Podiatry

## 2017-11-16 DIAGNOSIS — I739 Peripheral vascular disease, unspecified: Secondary | ICD-10-CM

## 2017-11-16 DIAGNOSIS — M79609 Pain in unspecified limb: Secondary | ICD-10-CM | POA: Diagnosis not present

## 2017-11-16 DIAGNOSIS — L84 Corns and callosities: Secondary | ICD-10-CM

## 2017-11-16 DIAGNOSIS — D689 Coagulation defect, unspecified: Secondary | ICD-10-CM

## 2017-11-16 DIAGNOSIS — B351 Tinea unguium: Secondary | ICD-10-CM

## 2017-11-16 NOTE — Progress Notes (Signed)
Complaint:  Visit Type: Patient returns to my office for continued preventative foot care services. Complaint: Patient states" my nails have grown long and thick and become painful to walk and wear shoes"  The patient presents for preventative foot care services. No changes to ROS. Plavix taken by patient.  Podiatric Exam: Vascular: dorsalis pedis and posterior tibial pulses are palpable bilateral. Capillary return is immediate. Temperature gradient is WNL. Skin turgor WNL  Sensorium: Dimished  Semmes Weinstein monofilament test. Normal tactile sensation bilaterally. Nail Exam: Pt has thick disfigured discolored nails with subungual debris noted bilateral entire nail hallux through fifth toenails Ulcer Exam: There is no evidence of ulcer or pre-ulcerative changes or infection. Orthopedic Exam: Muscle tone and strength are WNL. No limitations in general ROM. No crepitus or effusions noted. Foot type and digits show no abnormalities. Bony prominences are unremarkable. Skin: No Porokeratosis. No infection or ulcers.  Digital clavi third toe right foot.  Diagnosis:  Onychomycosis, , Pain in right toe, pain in left toes Digital Clavi  Treatment & Plan Procedures and Treatment: Consent by patient was obtained for treatment procedures. The patient understood the discussion of treatment and procedures well. All questions were answered thoroughly reviewed. Debridement of mycotic and hypertrophic toenails, 1 through 5 bilateral and clearing of subungual debris. No ulceration, no infection noted.   Return Visit-Office Procedure: Patient instructed to return to the office for a follow up visit 3 months for continued evaluation and treatment.    Gardiner Barefoot DPM

## 2018-01-08 DIAGNOSIS — H401132 Primary open-angle glaucoma, bilateral, moderate stage: Secondary | ICD-10-CM | POA: Diagnosis not present

## 2018-02-19 ENCOUNTER — Ambulatory Visit (INDEPENDENT_AMBULATORY_CARE_PROVIDER_SITE_OTHER): Payer: PPO | Admitting: Podiatry

## 2018-02-19 ENCOUNTER — Encounter: Payer: Self-pay | Admitting: Podiatry

## 2018-02-19 DIAGNOSIS — B351 Tinea unguium: Secondary | ICD-10-CM | POA: Diagnosis not present

## 2018-02-19 DIAGNOSIS — L84 Corns and callosities: Secondary | ICD-10-CM | POA: Diagnosis not present

## 2018-02-19 DIAGNOSIS — I739 Peripheral vascular disease, unspecified: Secondary | ICD-10-CM

## 2018-02-19 DIAGNOSIS — D689 Coagulation defect, unspecified: Secondary | ICD-10-CM | POA: Diagnosis not present

## 2018-02-19 DIAGNOSIS — M79609 Pain in unspecified limb: Secondary | ICD-10-CM

## 2018-02-19 NOTE — Progress Notes (Signed)
Complaint:  Visit Type: Patient returns to my office for continued preventative foot care services. Complaint: Patient states" my nails have grown long and thick and become painful to walk and wear shoes"  The patient presents for preventative foot care services. No changes to ROS. Plavix taken by patient. Patient has painful corn on the tip of 3rd toe right foot.  Podiatric Exam: Vascular: dorsalis pedis and posterior tibial pulses are palpable bilateral. Capillary return is immediate. Temperature gradient is WNL. Skin turgor WNL  Sensorium: Dimished  Semmes Weinstein monofilament test. Normal tactile sensation bilaterally. Nail Exam: Pt has thick disfigured discolored nails with subungual debris noted bilateral entire nail hallux through fifth toenails.  Hallux toenail right foot has fragmented. Ulcer Exam: There is no evidence of ulcer or pre-ulcerative changes or infection. Orthopedic Exam: Muscle tone and strength are WNL. No limitations in general ROM. No crepitus or effusions noted. Foot type and digits show no abnormalities. Bony prominences are unremarkable. Skin: No Porokeratosis. No infection or ulcers.  Digital clavi third toe right foot.  Diagnosis:  Onychomycosis, , Pain in right toe, pain in left toes Digital Clavi  Treatment & Plan Procedures and Treatment: Consent by patient was obtained for treatment procedures. The patient understood the discussion of treatment and procedures well. All questions were answered thoroughly reviewed. Debridement of mycotic and hypertrophic toenails, 1 through 5 bilateral and clearing of subungual debris. No ulceration, no infection noted.   Return Visit-Office Procedure: Patient instructed to return to the office for a follow up visit 3 months for continued evaluation and treatment.    Gardiner Barefoot DPM

## 2018-03-10 ENCOUNTER — Other Ambulatory Visit: Payer: Self-pay

## 2018-03-10 ENCOUNTER — Ambulatory Visit
Admission: EM | Admit: 2018-03-10 | Discharge: 2018-03-10 | Disposition: A | Discharge status: Home / Self Care | Payer: PPO | Attending: Family Medicine | Admitting: Family Medicine

## 2018-03-10 DIAGNOSIS — T7840XA Allergy, unspecified, initial encounter: Secondary | ICD-10-CM

## 2018-03-10 DIAGNOSIS — R21 Rash and other nonspecific skin eruption: Secondary | ICD-10-CM | POA: Diagnosis not present

## 2018-03-10 MED ORDER — PREDNISONE 10 MG PO TABS: ORAL_TABLET | ORAL | 0 refills | Status: DC

## 2018-03-10 MED ORDER — HYDROXYZINE HCL 25 MG PO TABS: 25.0000 mg | ORAL_TABLET | Freq: Every evening | ORAL | 0 refills | Status: AC | PRN

## 2018-03-10 MED ORDER — PREDNISONE 10 MG PO TABS: ORAL_TABLET | ORAL | 0 refills | Status: AC

## 2018-03-10 MED ORDER — HYDROXYZINE HCL 25 MG PO TABS: 25.0000 mg | ORAL_TABLET | Freq: Every evening | ORAL | 0 refills | Status: DC | PRN

## 2018-03-10 NOTE — ED Triage Notes (Addendum)
Pt with red rash to face, arms and neck and burns and itches. Started on Thursday after having her hair done but unsure what is causing it.

## 2018-03-10 NOTE — ED Provider Notes (Signed)
[facility]    CSN: [id number] Arrival date & time: 03/10/18  1054  History   Chief Complaint Chief Complaint  Patient presents with  . Rash   HPI  84 year old female presents with rash.  Started on Thursday to Friday.  Patient states that she got her hair done recently.  No new exposures that she is aware of.  Location:  Face, upper chest/neck.  Rash is red, burning, and itches.  No medications or interventions tried.  No known exacerbating or relieving factors.  No other associated symptoms.  No other complaints.  PMH, Surgical Hx, Family Hx, Social History reviewed and updated as below.  Past Medical History:  Diagnosis Date  . Arthritis   . Atrial fibrillation ([place])   . GERD (gastroesophageal reflux disease)   . Glaucoma   . HBP (high blood pressure)   . Heart trouble   . Hyperlipidemia   . PVD (peripheral vascular disease) [facility])     Patient Active Problem List   Diagnosis Date Noted  . Afib ([place]) 11/14/2016  . Angina pectoris ([place]) 11/14/2016  . PVD (peripheral vascular disease) ([place]) 11/14/2016  . SVT (supraventricular tachycardia) ([place]) 11/14/2016  . Renal artery stenosis ([place]) 10/24/2016  . Atherosclerosis of native arteries of extremity with intermittent claudication ([place]) 10/24/2016  . CAD (coronary artery disease) 10/24/2016  . HTN (hypertension) 10/24/2016  . Hypercholesteremia 10/24/2016  . Aortic occlusion ([place]) 08/30/2014  . CRI (chronic renal insufficiency) 07/11/2013  . Murmur, cardiac 07/11/2013  . SOB (shortness of breath) 07/11/2013    Past Surgical History:  Procedure Laterality Date  . ABDOMINAL HYSTERECTOMY    . APPENDECTOMY    . CEREBRAL ANEURYSM REPAIR    . DILATION AND CURETTAGE OF UTERUS    . FEMORAL-FEMORAL BYPASS GRAFT Bilateral 08/29/2014   Procedure: bilateral aorta iliac thrombectomy, thromboembolectomy, bilateral femoral thromboembolectomy;  Surgeon: Serafina Mitchell, MD;  Location: [facility] ORS;  Service: Vascular;   Laterality: Bilateral;  . KIDNEY STENT    . TUBAL LIGATION      OB History   No obstetric history on file.      Home Medications    Prior to Admission medications   Medication Sig Start Date End Date Taking? Authorizing Provider  amLODipine (NORVASC) 5 MG tablet Take 5 mg by mouth daily.    [provider]  aspirin EC 81 MG tablet Take by mouth.    [provider]  atorvastatin (LIPITOR) 20 MG tablet Take 20 mg by mouth daily.    [provider]  bumetanide (BUMEX) 0.5 MG tablet Take 0.5 mg by mouth daily.    [provider]  clopidogrel (PLAVIX) 75 MG tablet Take 75 mg by mouth daily.    [provider]  diazepam (VALIUM) 5 MG tablet Take by mouth.    [provider]  dorzolamide (TRUSOPT) 2 % ophthalmic solution 1 drop 3 (three) times daily.    [provider]  dorzolamide-timolol (COSOPT) 22.3-6.8 MG/ML ophthalmic solution Place 1 drop into both eyes 3 (three) times daily.    [provider]  feeding supplement, ENSURE ENLIVE, (ENSURE ENLIVE) LIQD Take 237 mLs by mouth 3 (three) times daily between meals. 09/06/14   Theodoro Grist, MD  ferrous sulfate 325 (65 FE) MG tablet Take 1 tablet (325 mg total) by mouth 2 (two) times daily with a meal. 09/06/14   Theodoro Grist, MD  hydrALAZINE (APRESOLINE) 25 MG tablet Take 25 mg by mouth daily.    [provider]  hydrOXYzine (ATARAX/VISTARIL) 25 MG tablet Take 1 tablet (25 mg total) by mouth at bedtime as needed for itching. 03/10/18   Coral Spikes, DO  isosorbide mononitrate (IMDUR) 30 MG 24 hr tablet  10/27/17   [provider]  loperamide (IMODIUM) 2 MG capsule Take 1 capsule (2 mg total) by mouth as needed for diarrhea or loose stools. 08/17/14   Earleen Newport, MD  olmesartan (BENICAR) 40 MG tablet Take 40 mg by mouth daily.    [provider]  omeprazole (PRILOSEC) 20 MG capsule  09/08/17   [provider]  pantoprazole  (PROTONIX) 40 MG tablet Take 1 tablet (40 mg total) by mouth daily. 08/17/14 08/17/15  Earleen Newport, MD  predniSONE (DELTASONE) 10 MG tablet 50 mg daily x 2 days, then 40 mg daily x 2 days, then 30 mg daily x 2 days, then 20 mg daily x 2 days, then 10 mg daily x 2 days. 03/10/18   Coral Spikes, DO  solifenacin (VESICARE) 5 MG tablet Take 5 mg by mouth daily.    [provider]  sotalol (BETAPACE) 120 MG tablet Take 120 mg by mouth 2 (two) times daily.    [provider]  Travoprost, BAK Free, (TRAVATAN) 0.004 % SOLN ophthalmic solution Place 1 drop into both eyes at bedtime.    [provider]    Family History Family History  Problem Relation Age of Onset  . Lung cancer Mother   . Cerebral aneurysm Father     Social History Social History   Tobacco Use  . Smoking status: Never Smoker  . Smokeless tobacco: Never Used  Substance Use Topics  . Alcohol use: No  . Drug use: No   Allergies   Mebaral [mephobarbital]; Wool alcohol [lanolin]; Azor [amlodipine-olmesartan]; Clindamycin/lincomycin; Iodine; Penicillins; Sulfa antibiotics; and Sulfamethoxazole-trimethoprim  Review of Systems Review of Systems  Constitutional: Negative for fever.  Skin: Positive for rash.   Physical Exam Triage Vital Signs ED Triage Vitals  Enc Vitals Group     BP 03/10/18 1128 (!) 179/98     Pulse Rate 03/10/18 1128 (!) 58     Resp 03/10/18 1128 16     Temp 03/10/18 1128 98.2 F (36.8 C)     Temp Source 03/10/18 1128 Oral     SpO2 03/10/18 1128 99 %     Weight 03/10/18 1132 119 lb (54 kg)     Height 03/10/18 1132 5\' 6"  (1.676 m)     Head Circumference --      Peak Flow --      Pain Score 03/10/18 1131 5     Pain Loc --      Pain Edu? --      Excl. in [place]? --    Updated Vital Signs BP (!) 179/98 (BP Location: Left Arm) Comment: hasn't taken b/p med yet today  Pulse (!) 58   Temp 98.2 F (36.8 C) (Oral)   Resp 16   Ht 5\' 6"  (1.676 m)   Wt 54 kg   SpO2 99%    BMI 19.21 kg/m   Visual Acuity Right Eye Distance:   Left Eye Distance:   Bilateral Distance:    Right Eye Near:   Left Eye Near:    Bilateral Near:     Physical Exam Vitals signs and nursing note reviewed.  Constitutional:      General: She is not in acute distress. HENT:     Head: Normocephalic and atraumatic.  Nose: Nose normal.  Eyes:     General:        Right eye: No discharge.        Left eye: No discharge.     Conjunctiva/sclera: Conjunctivae normal.  Cardiovascular:     Rate and Rhythm: Normal rate and regular rhythm.     Heart sounds: Murmur present.  Pulmonary:     Effort: Pulmonary effort is normal. No respiratory distress.  Skin:    Comments: Face with severe erythema and dryness.  This extends distally to the neck and upper chest.  Neurological:     Mental Status: She is alert.  Psychiatric:        Mood and Affect: Mood normal.        Behavior: Behavior normal.    UC Treatments / Results  Labs (all labs ordered are listed, but only abnormal results are displayed) Labs Reviewed - No data to display  EKG None  Radiology No results found.  Procedures Procedures (including critical care time)  Medications Ordered in UC Medications - No data to display  Initial Impression / Assessment and Plan / UC Course  I have reviewed the triage vital signs and the nursing notes.  Pertinent labs & imaging results that were available during my care of the patient were reviewed by me and considered in my medical decision making (see chart for details).    84 year old female presents with rash.  Appears to be allergic response/allergic reaction.  Treating with prednisone.  Atarax for severe itching at night.  Final Clinical Impressions(s) / UC Diagnoses   Final diagnoses:  Rash  Allergic reaction, initial encounter     Discharge Instructions     Medication as prescribed.  Take care  Dr. Lacinda Axon    ED Prescriptions    Medication Sig Dispense  Auth. Provider   predniSONE (DELTASONE) 10 MG tablet  (Status: Discontinued) 50 mg daily x 2 days, then 40 mg daily x 2 days, then 30 mg daily x 2 days, then 20 mg daily x 2 days, then 10 mg daily x 2 days. 30 tablet Hazelle Woollard G, DO   hydrOXYzine (ATARAX/VISTARIL) 25 MG tablet  (Status: Discontinued) Take 1 tablet (25 mg total) by mouth at bedtime as needed for itching. 14 tablet Krisha Beegle G, DO   hydrOXYzine (ATARAX/VISTARIL) 25 MG tablet Take 1 tablet (25 mg total) by mouth at bedtime as needed for itching. 14 tablet Kenniel Bergsma G, DO   predniSONE (DELTASONE) 10 MG tablet 50 mg daily x 2 days, then 40 mg daily x 2 days, then 30 mg daily x 2 days, then 20 mg daily x 2 days, then 10 mg daily x 2 days. 30 tablet Coral Spikes, DO     Controlled Substance Prescriptions [place] Controlled Substance Registry consulted? Not Applicable   Coral Spikes, DO 03/10/18 1230

## 2018-03-10 NOTE — Discharge Instructions (Signed)
Medication as prescribed.  Take care  Dr. Waris Rodger  

## 2018-03-12 ENCOUNTER — Other Ambulatory Visit: Payer: Self-pay

## 2018-03-12 NOTE — Patient Outreach (Signed)
[facility]) Care Management  03/12/2018  Lori Roberts Dec 27, 1921 [id number]   Referral Date: 03/12/18 Referral Source: Nurseline Referral Reason: Facial swelling/ redness   Outreach Attempt: No answer.  Unable to leave a message.     Plan: RN CM will attempt again within 4 business days and send letter.     Jone Baseman, RN, MSN [facility] Care Management Care Management Coordinator Direct Line [phone] Toll Free: [phone]  Fax: [phone]

## 2018-03-13 ENCOUNTER — Other Ambulatory Visit: Payer: Self-pay

## 2018-03-13 NOTE — Patient Outreach (Signed)
[facility]) Care Management  03/13/2018  Lori Roberts 1922/02/26 [id number]   Referral Date: 03/12/18 Referral Source: Nurseline Referral Reason: Facial swelling/ redness  Spoke with patient. She is able to verify HIPAA.  Addressed nurseline call.  Patient states she is doing better.  She states that her face is not as red and feels better.  She states she went to the urgent care and was given medication for a reaction. She states that she used a new cream and that what she thinks could have caused the reaction. Patient declines no other questions, concerns, or needs at this time.    Plan: RN CM will close case.  Jone Baseman, RN, MSN [place] [facility] Care Management Coordinator Direct Line [phone] Cell [phone] Toll Free: [phone]  Fax: [phone]

## 2018-03-31 ENCOUNTER — Inpatient Hospital Stay
Admission: EM | Admit: 2018-03-31 | Discharge: 2018-04-21 | DRG: 689 | Disposition: E | Payer: PPO | Attending: Internal Medicine | Admitting: Internal Medicine

## 2018-03-31 ENCOUNTER — Other Ambulatory Visit: Payer: Self-pay

## 2018-03-31 ENCOUNTER — Encounter: Payer: Self-pay | Admitting: Emergency Medicine

## 2018-03-31 ENCOUNTER — Emergency Department: Payer: PPO

## 2018-03-31 DIAGNOSIS — Z90710 Acquired absence of both cervix and uterus: Secondary | ICD-10-CM

## 2018-03-31 DIAGNOSIS — R1084 Generalized abdominal pain: Secondary | ICD-10-CM | POA: Diagnosis not present

## 2018-03-31 DIAGNOSIS — K5730 Diverticulosis of large intestine without perforation or abscess without bleeding: Secondary | ICD-10-CM | POA: Diagnosis not present

## 2018-03-31 DIAGNOSIS — E43 Unspecified severe protein-calorie malnutrition: Secondary | ICD-10-CM | POA: Diagnosis present

## 2018-03-31 DIAGNOSIS — B9620 Unspecified Escherichia coli [E. coli] as the cause of diseases classified elsewhere: Secondary | ICD-10-CM | POA: Diagnosis not present

## 2018-03-31 DIAGNOSIS — I4891 Unspecified atrial fibrillation: Secondary | ICD-10-CM | POA: Diagnosis not present

## 2018-03-31 DIAGNOSIS — Z66 Do not resuscitate: Secondary | ICD-10-CM | POA: Diagnosis present

## 2018-03-31 DIAGNOSIS — Z7902 Long term (current) use of antithrombotics/antiplatelets: Secondary | ICD-10-CM

## 2018-03-31 DIAGNOSIS — I2510 Atherosclerotic heart disease of native coronary artery without angina pectoris: Secondary | ICD-10-CM | POA: Diagnosis present

## 2018-03-31 DIAGNOSIS — Z7982 Long term (current) use of aspirin: Secondary | ICD-10-CM | POA: Diagnosis not present

## 2018-03-31 DIAGNOSIS — K922 Gastrointestinal hemorrhage, unspecified: Secondary | ICD-10-CM | POA: Diagnosis not present

## 2018-03-31 DIAGNOSIS — I10 Essential (primary) hypertension: Secondary | ICD-10-CM | POA: Diagnosis present

## 2018-03-31 DIAGNOSIS — Z681 Body mass index (BMI) 19 or less, adult: Secondary | ICD-10-CM

## 2018-03-31 DIAGNOSIS — Z79899 Other long term (current) drug therapy: Secondary | ICD-10-CM

## 2018-03-31 DIAGNOSIS — K219 Gastro-esophageal reflux disease without esophagitis: Secondary | ICD-10-CM | POA: Diagnosis not present

## 2018-03-31 DIAGNOSIS — E785 Hyperlipidemia, unspecified: Secondary | ICD-10-CM | POA: Diagnosis present

## 2018-03-31 DIAGNOSIS — K921 Melena: Secondary | ICD-10-CM | POA: Diagnosis not present

## 2018-03-31 DIAGNOSIS — R109 Unspecified abdominal pain: Secondary | ICD-10-CM | POA: Diagnosis not present

## 2018-03-31 DIAGNOSIS — N390 Urinary tract infection, site not specified: Secondary | ICD-10-CM | POA: Diagnosis not present

## 2018-03-31 DIAGNOSIS — E860 Dehydration: Secondary | ICD-10-CM | POA: Diagnosis not present

## 2018-03-31 DIAGNOSIS — R140 Abdominal distension (gaseous): Secondary | ICD-10-CM | POA: Diagnosis not present

## 2018-03-31 DIAGNOSIS — E876 Hypokalemia: Secondary | ICD-10-CM | POA: Diagnosis not present

## 2018-03-31 DIAGNOSIS — I739 Peripheral vascular disease, unspecified: Secondary | ICD-10-CM | POA: Diagnosis not present

## 2018-03-31 DIAGNOSIS — N3000 Acute cystitis without hematuria: Secondary | ICD-10-CM | POA: Diagnosis not present

## 2018-03-31 DIAGNOSIS — K529 Noninfective gastroenteritis and colitis, unspecified: Secondary | ICD-10-CM | POA: Diagnosis not present

## 2018-03-31 DIAGNOSIS — R1030 Lower abdominal pain, unspecified: Secondary | ICD-10-CM | POA: Diagnosis not present

## 2018-03-31 LAB — COMPREHENSIVE METABOLIC PANEL
ALBUMIN: 3.8 g/dL (ref 3.5–5.0)
ALT: 39 U/L (ref 0–44)
ANION GAP: 10 (ref 5–15)
AST: 44 U/L — ABNORMAL HIGH (ref 15–41)
Alkaline Phosphatase: 81 U/L (ref 38–126)
BUN: 29 mg/dL — ABNORMAL HIGH (ref 8–23)
CALCIUM: 9.4 mg/dL (ref 8.9–10.3)
CHLORIDE: 105 mmol/L (ref 98–111)
CO2: 26 mmol/L (ref 22–32)
Creatinine, Ser: 0.96 mg/dL (ref 0.44–1.00)
GFR calc non Af Amer: 51 mL/min — ABNORMAL LOW (ref >60)
GFR, EST AFRICAN AMERICAN: 59 mL/min — AB (ref >60)
Glucose, Bld: 187 mg/dL — ABNORMAL HIGH (ref 70–99)
POTASSIUM: 3.2 mmol/L — AB (ref 3.5–5.1)
Sodium: 141 mmol/L (ref 135–145)
Total Bilirubin: 0.9 mg/dL (ref 0.3–1.2)
Total Protein: 6.5 g/dL (ref 6.5–8.1)

## 2018-03-31 LAB — CBC
HEMATOCRIT: 35.4 % — AB (ref 36.0–46.0)
HEMOGLOBIN: 11.3 g/dL — AB (ref 12.0–15.0)
MCH: 28.7 pg (ref 26.0–34.0)
MCHC: 31.9 g/dL (ref 30.0–36.0)
MCV: 89.8 fL (ref 80.0–100.0)
NRBC: 0 % (ref 0.0–0.2)
Platelets: 177 10*3/uL (ref 150–400)
RBC: 3.94 MIL/uL (ref 3.87–5.11)
RDW: 13.5 % (ref 11.5–15.5)
WBC: 10.1 10*3/uL (ref 4.0–10.5)

## 2018-03-31 LAB — GASTROINTESTINAL PANEL BY PCR, STOOL (REPLACES STOOL CULTURE)
ASTROVIRUS: NOT DETECTED
Adenovirus F40/41: NOT DETECTED
Campylobacter species: NOT DETECTED
Cryptosporidium: NOT DETECTED
Cyclospora cayetanensis: NOT DETECTED
ENTAMOEBA HISTOLYTICA: NOT DETECTED
ENTEROAGGREGATIVE E COLI (EAEC): NOT DETECTED
ENTEROTOXIGENIC E COLI (ETEC): NOT DETECTED
Enteropathogenic E coli (EPEC): NOT DETECTED
GIARDIA LAMBLIA: NOT DETECTED
Norovirus GI/GII: NOT DETECTED
Plesimonas shigelloides: NOT DETECTED
Rotavirus A: NOT DETECTED
SHIGA LIKE TOXIN PRODUCING E COLI (STEC): NOT DETECTED
Salmonella species: NOT DETECTED
Sapovirus (I, II, IV, and V): NOT DETECTED
Shigella/Enteroinvasive E coli (EIEC): NOT DETECTED
VIBRIO CHOLERAE: NOT DETECTED
Vibrio species: NOT DETECTED
Yersinia enterocolitica: NOT DETECTED

## 2018-03-31 LAB — URINALYSIS, COMPLETE (UACMP) WITH MICROSCOPIC
BILIRUBIN URINE: NEGATIVE
Glucose, UA: NEGATIVE mg/dL
Hgb urine dipstick: NEGATIVE
KETONES UR: NEGATIVE mg/dL
LEUKOCYTES UA: NEGATIVE
Nitrite: POSITIVE — AB
PH: 7 (ref 5.0–8.0)
Protein, ur: 30 mg/dL — AB
Specific Gravity, Urine: 1.013 (ref 1.005–1.030)

## 2018-03-31 LAB — C DIFFICILE QUICK SCREEN W PCR REFLEX
C Diff antigen: NEGATIVE
C Diff interpretation: NOT DETECTED
C Diff toxin: NEGATIVE

## 2018-03-31 LAB — TSH: TSH: 1.663 u[IU]/mL (ref 0.350–4.500)

## 2018-03-31 LAB — LIPASE, BLOOD: LIPASE: 23 U/L (ref 11–51)

## 2018-03-31 MED ORDER — CIPROFLOXACIN IN D5W 200 MG/100ML IV SOLN: 200.0000 mg | Freq: Two times a day (BID) | INTRAVENOUS | Status: DC

## 2018-03-31 MED ORDER — ENOXAPARIN SODIUM 40 MG/0.4ML ~~LOC~~ SOLN
40.0000 mg | SUBCUTANEOUS | Status: DC
  Administered 2018-03-31 – 2018-04-02 (×3): 40 mg via SUBCUTANEOUS
  Filled 2018-03-31 (×3): qty 0.4

## 2018-03-31 MED ORDER — ATORVASTATIN CALCIUM 20 MG PO TABS
20.0000 mg | ORAL_TABLET | Freq: Every evening | ORAL | Status: DC
  Administered 2018-04-01 – 2018-04-02 (×2): 20 mg via ORAL
  Filled 2018-03-31 (×2): qty 1

## 2018-03-31 MED ORDER — ACETAMINOPHEN 650 MG RE SUPP: 650.0000 mg | Freq: Four times a day (QID) | RECTAL | Status: DC | PRN

## 2018-03-31 MED ORDER — ISOSORBIDE MONONITRATE ER 30 MG PO TB24
30.0000 mg | ORAL_TABLET | Freq: Every day | ORAL | Status: DC
  Administered 2018-04-01 – 2018-04-02 (×2): 30 mg via ORAL
  Filled 2018-03-31 (×2): qty 1

## 2018-03-31 MED ORDER — IRBESARTAN 75 MG PO TABS
37.5000 mg | ORAL_TABLET | Freq: Every day | ORAL | Status: DC
  Administered 2018-04-01 – 2018-04-02 (×2): 37.5 mg via ORAL
  Filled 2018-03-31 (×3): qty 0.5

## 2018-03-31 MED ORDER — HYDRALAZINE HCL 25 MG PO TABS
25.0000 mg | ORAL_TABLET | Freq: Two times a day (BID) | ORAL | Status: DC
  Administered 2018-03-31 – 2018-04-02 (×4): 25 mg via ORAL
  Filled 2018-03-31 (×5): qty 1

## 2018-03-31 MED ORDER — ONDANSETRON HCL 4 MG/2ML IJ SOLN
4.0000 mg | Freq: Once | INTRAMUSCULAR | Status: AC
  Administered 2018-03-31: 4 mg via INTRAVENOUS
  Filled 2018-03-31: qty 2

## 2018-03-31 MED ORDER — ONDANSETRON HCL 4 MG/2ML IJ SOLN
4.0000 mg | Freq: Four times a day (QID) | INTRAMUSCULAR | Status: DC | PRN
  Administered 2018-04-01 – 2018-04-02 (×2): 4 mg via INTRAVENOUS
  Filled 2018-03-31 (×2): qty 2

## 2018-03-31 MED ORDER — SOTALOL HCL 80 MG PO TABS
40.0000 mg | ORAL_TABLET | Freq: Every day | ORAL | Status: DC
  Administered 2018-04-01 – 2018-04-02 (×2): 40 mg via ORAL
  Filled 2018-03-31 (×3): qty 0.5

## 2018-03-31 MED ORDER — DARIFENACIN HYDROBROMIDE ER 7.5 MG PO TB24
7.5000 mg | ORAL_TABLET | Freq: Every day | ORAL | Status: DC
  Administered 2018-04-01 – 2018-04-02 (×2): 7.5 mg via ORAL
  Filled 2018-03-31 (×3): qty 1

## 2018-03-31 MED ORDER — CIPROFLOXACIN IN D5W 400 MG/200ML IV SOLN
400.0000 mg | Freq: Once | INTRAVENOUS | Status: AC
  Administered 2018-03-31: 400 mg via INTRAVENOUS
  Filled 2018-03-31: qty 200

## 2018-03-31 MED ORDER — HYDROXYZINE HCL 25 MG PO TABS
25.0000 mg | ORAL_TABLET | Freq: Every evening | ORAL | Status: DC | PRN
  Filled 2018-03-31: qty 1

## 2018-03-31 MED ORDER — ACETAMINOPHEN 325 MG PO TABS
650.0000 mg | ORAL_TABLET | Freq: Four times a day (QID) | ORAL | Status: DC | PRN
  Administered 2018-04-01 – 2018-04-02 (×3): 650 mg via ORAL
  Filled 2018-03-31 (×3): qty 2

## 2018-03-31 MED ORDER — AMLODIPINE BESYLATE 5 MG PO TABS
5.0000 mg | ORAL_TABLET | Freq: Every day | ORAL | Status: DC
  Administered 2018-04-01 – 2018-04-02 (×2): 5 mg via ORAL
  Filled 2018-03-31 (×2): qty 1

## 2018-03-31 MED ORDER — POTASSIUM CHLORIDE CRYS ER 20 MEQ PO TBCR
40.0000 meq | EXTENDED_RELEASE_TABLET | Freq: Once | ORAL | Status: AC
  Administered 2018-03-31: 40 meq via ORAL
  Filled 2018-03-31: qty 2

## 2018-03-31 MED ORDER — ONDANSETRON HCL 4 MG PO TABS: 4.0000 mg | ORAL_TABLET | Freq: Four times a day (QID) | ORAL | Status: DC | PRN

## 2018-03-31 MED ORDER — CLOPIDOGREL BISULFATE 75 MG PO TABS
75.0000 mg | ORAL_TABLET | Freq: Every day | ORAL | Status: DC
  Administered 2018-04-01 – 2018-04-02 (×2): 75 mg via ORAL
  Filled 2018-03-31 (×2): qty 1

## 2018-03-31 MED ORDER — CIPROFLOXACIN IN D5W 400 MG/200ML IV SOLN
200.0000 mg | Freq: Two times a day (BID) | INTRAVENOUS | Status: DC
  Administered 2018-04-01: 200 mg via INTRAVENOUS
  Filled 2018-03-31 (×2): qty 100

## 2018-03-31 MED ORDER — LATANOPROST 0.005 % OP SOLN
1.0000 [drp] | Freq: Every day | OPHTHALMIC | Status: DC
  Administered 2018-04-01 – 2018-04-02 (×2): 1 [drp] via OPHTHALMIC
  Filled 2018-03-31: qty 2.5

## 2018-03-31 MED ORDER — LOPERAMIDE HCL 2 MG PO CAPS
4.0000 mg | ORAL_CAPSULE | Freq: Four times a day (QID) | ORAL | Status: DC
  Filled 2018-03-31: qty 2

## 2018-03-31 MED ORDER — SODIUM CHLORIDE 0.9 % IV SOLN
INTRAVENOUS | Status: DC
  Administered 2018-03-31 – 2018-04-02 (×3): via INTRAVENOUS

## 2018-03-31 MED ORDER — LOPERAMIDE HCL 2 MG PO CAPS: 2.0000 mg | ORAL_CAPSULE | Freq: Four times a day (QID) | ORAL | Status: DC | PRN

## 2018-03-31 MED ORDER — SODIUM CHLORIDE 0.9 % IV SOLN
Freq: Once | INTRAVENOUS | Status: AC
  Administered 2018-03-31: 16:00:00 via INTRAVENOUS

## 2018-03-31 MED ORDER — DORZOLAMIDE HCL-TIMOLOL MAL 2-0.5 % OP SOLN
1.0000 [drp] | Freq: Two times a day (BID) | OPHTHALMIC | Status: DC
  Administered 2018-04-01 – 2018-04-02 (×4): 1 [drp] via OPHTHALMIC
  Filled 2018-03-31: qty 10

## 2018-03-31 MED ORDER — PANTOPRAZOLE SODIUM 40 MG PO TBEC
40.0000 mg | DELAYED_RELEASE_TABLET | Freq: Every day | ORAL | Status: DC
  Administered 2018-04-01 – 2018-04-02 (×2): 40 mg via ORAL
  Filled 2018-03-31 (×2): qty 1

## 2018-03-31 NOTE — ED Provider Notes (Signed)
[facility] Emergency Department Provider Note       Time seen: ----------------------------------------- 3:45 PM on 03/21/2018 -----------------------------------------   I have reviewed the triage vital signs and the nursing notes.  HISTORY   Chief Complaint Abdominal Pain; Nausea; and Diarrhea    HPI Lori Roberts is a 85 y.o. female with a history of arthritis, atrial fibrillation, GERD, glaucoma, hypertension, hyperlipidemia, peripheral vascular disease who presents to the ED for abdominal pain and diarrhea for the past week.  Patient states she has seen some blood in her diarrhea.  She currently takes aspirin and Plavix for anticoagulation.  Past Medical History:  Diagnosis Date  . Arthritis   . Atrial fibrillation ([place])   . GERD (gastroesophageal reflux disease)   . Glaucoma   . HBP (high blood pressure)   . Heart trouble   . Hyperlipidemia   . PVD (peripheral vascular disease) [facility])     Patient Active Problem List   Diagnosis Date Noted  . Afib ([place]) 11/14/2016  . Angina pectoris ([place]) 11/14/2016  . PVD (peripheral vascular disease) ([place]) 11/14/2016  . SVT (supraventricular tachycardia) ([place]) 11/14/2016  . Renal artery stenosis ([place]) 10/24/2016  . Atherosclerosis of native arteries of extremity with intermittent claudication ([place]) 10/24/2016  . CAD (coronary artery disease) 10/24/2016  . HTN (hypertension) 10/24/2016  . Hypercholesteremia 10/24/2016  . Aortic occlusion ([place]) 08/30/2014  . CRI (chronic renal insufficiency) 07/11/2013  . Murmur, cardiac 07/11/2013  . SOB (shortness of breath) 07/11/2013    Past Surgical History:  Procedure Laterality Date  . ABDOMINAL HYSTERECTOMY    . APPENDECTOMY    . CEREBRAL ANEURYSM REPAIR    . DILATION AND CURETTAGE OF UTERUS    . FEMORAL-FEMORAL BYPASS GRAFT Bilateral 08/29/2014   Procedure: bilateral aorta iliac thrombectomy, thromboembolectomy, bilateral femoral thromboembolectomy;   Surgeon: Serafina Mitchell, MD;  Location: [facility] ORS;  Service: Vascular;  Laterality: Bilateral;  . KIDNEY STENT    . TUBAL LIGATION      Allergies Mebaral [mephobarbital]; Wool alcohol [lanolin]; Azor [amlodipine-olmesartan]; Clindamycin/lincomycin; Iodine; Penicillins; Sulfa antibiotics; and Sulfamethoxazole-trimethoprim  Social History Social History   Tobacco Use  . Smoking status: Never Smoker  . Smokeless tobacco: Never Used  Substance Use Topics  . Alcohol use: No  . Drug use: No   Review of Systems Constitutional: Negative for fever. Cardiovascular: Negative for chest pain. Respiratory: Negative for shortness of breath. Gastrointestinal: Positive for abdominal pain, diarrhea Musculoskeletal: Negative for back pain. Skin: Negative for rash. Neurological: Negative for headaches, focal weakness or numbness.  All systems negative/normal/unremarkable except as stated in the HPI  ____________________________________________   PHYSICAL EXAM:  VITAL SIGNS: ED Triage Vitals  Enc Vitals Group     BP 04/04/2018 1402 (!) 226/84     Pulse --      Resp 04/07/2018 1402 18     Temp 04/11/2018 1402 98.5 F (36.9 C)     Temp src --      SpO2 03/22/2018 1402 97 %     Weight 04/20/2018 1403 119 lb (54 kg)     Height 04/04/2018 1403 5\' 6"  (1.676 m)     Head Circumference --      Peak Flow --      Pain Score 03/25/2018 1403 5     Pain Loc --      Pain Edu? --      Excl. in [place]? --    Constitutional: Alert and oriented. Well appearing and in no distress. Eyes:  Conjunctivae are normal. Normal extraocular movements. Cardiovascular: Normal rate, regular rhythm.  Systolic murmur Respiratory: Normal respiratory effort without tachypnea nor retractions. Breath sounds are clear and equal bilaterally. No wheezes/rales/rhonchi. Gastrointestinal: Central abdominal tenderness, no rebound or guarding.  Normal bowel sounds. Musculoskeletal: Nontender with normal range of motion in extremities. No lower  extremity tenderness nor edema. Neurologic:  Normal speech and language. No gross focal neurologic deficits are appreciated.  Skin:  Skin is warm, dry and intact. No rash noted. Psychiatric: Mood and affect are normal. Speech and behavior are normal.  ____________________________________________  ED COURSE:  As part of my medical decision making, I reviewed the following data within the [place] History obtained from family if available, nursing notes, old chart and ekg, as well as notes from prior ED visits. Patient presented for abdominal pain and diarrhea, we will assess with labs and imaging as indicated at this time.   Procedures ____________________________________________   LABS (pertinent positives/negatives)  Labs Reviewed  COMPREHENSIVE METABOLIC PANEL - Abnormal; Notable for the following components:      Result Value   Potassium 3.2 (*)    Glucose, Bld 187 (*)    BUN 29 (*)    AST 44 (*)    GFR calc non Af Amer 51 (*)    GFR calc Af Amer 59 (*)    All other components within normal limits  CBC - Abnormal; Notable for the following components:   Hemoglobin 11.3 (*)    HCT 35.4 (*)    All other components within normal limits  URINALYSIS, COMPLETE (UACMP) WITH MICROSCOPIC - Abnormal; Notable for the following components:   Color, Urine YELLOW (*)    APPearance HAZY (*)    Protein, ur 30 (*)    Nitrite POSITIVE (*)    Bacteria, UA MANY (*)    All other components within normal limits  GASTROINTESTINAL PANEL BY PCR, STOOL (REPLACES STOOL CULTURE)  C DIFFICILE QUICK SCREEN W PCR REFLEX  LIPASE, BLOOD    RADIOLOGY Images were viewed by me  CT the abdomen pelvis without contrast IMPRESSION: 1. No acute findings in the abdomen or pelvis. Specifically, no findings to explain the patient's history of abdominal pain and diarrhea. 2. Left colonic diverticulosis without diverticulitis. 3. Tiny right pleural effusion, stable. 4.  Aortic  Atherosclerois (ICD10-170.0) ____________________________________________   DIFFERENTIAL DIAGNOSIS   Colitis, diverticulitis, dehydration, electrolyte abnormality, anemia, GI bleed  FINAL ASSESSMENT AND PLAN  Gastroenteritis, gastrointestinal bleeding   Plan: The patient had presented for abdominal pain with diarrhea and bloody stools. Patient's labs did reveal some mild anemia, stool studies were negative here. Patient's imaging reveal any acute process.  I will discuss with the hospitalist for observation.   Laurence Aly, MD    Note: This note was generated in part or whole with voice recognition software. Voice recognition is usually quite accurate but there are transcription errors that can and very often do occur. I apologize for any typographical errors that were not detected and corrected.     Earleen Newport, MD 04/07/2018 [phone]

## 2018-03-31 NOTE — ED Triage Notes (Signed)
Pt to ER via EMS from home with c/o abdominal pain with diarrhea for last week.

## 2018-03-31 NOTE — ED Notes (Signed)
ED TO INPATIENT HANDOFF REPORT  Name/Age/Gender Lori Roberts 85 y.o. female  Code Status Code Status History    Date Active Date Inactive Code Status Order ID Comments User Context   08/30/2014 0216 09/06/2014 1625 Full Code [id number]  Serafina Mitchell, MD Inpatient    Advance Directive Documentation     Most Recent Value  Type of Advance Directive  Healthcare Power of Attorney, Living will  Pre-existing out of facility DNR order (yellow form or pink MOST form)  -  "MOST" Form in Place?  -      Home/SNF/Other Independent living facility  Chief Complaint Abd Pain   Level of Care/Admitting Diagnosis ED Disposition    ED Disposition Condition [place]: [place] [[id number]]  Level of Care: Med-Surg [16]  Diagnosis: Abdominal pain [626948]  Admitting Physician: Dustin Flock [[id number]]  Attending Physician: Dustin Flock [[id number]]  PT Class (Do Not Modify): Observation [104]  PT Acc Code (Do Not Modify): Observation [10022]       Medical History Past Medical History:  Diagnosis Date  . Arthritis   . Atrial fibrillation ([place])   . GERD (gastroesophageal reflux disease)   . Glaucoma   . HBP (high blood pressure)   . Heart trouble   . Hyperlipidemia   . PVD (peripheral vascular disease) ([facility])     Allergies Allergies  Allergen Reactions  . Mebaral [Mephobarbital] Rash and Shortness Of Breath  . Wool Alcohol [Lanolin] Swelling  . Azor [Amlodipine-Olmesartan] Rash  . Clindamycin/Lincomycin Rash  . Iodine Rash  . Penicillins Rash  . Sulfa Antibiotics Rash  . Sulfamethoxazole-Trimethoprim Rash    IV Location/Drains/Wounds Patient Lines/Drains/Airways Status   Active Line/Drains/Airways    Name:   Placement date:   Placement time:   Site:   Days:   Peripheral IV 04/18/2018 Right Antecubital   04/11/2018    1357    Antecubital   less than 1   Incision (Closed) 08/30/14 Groin Bilateral   08/30/14    0016     1309           Labs/Imaging Results for orders placed or performed during the hospital encounter of 04/15/2018 (from the past 48 hour(s))  Lipase, blood     Status: None   Collection Time: 03/22/2018  2:13 PM  Result Value Ref Range   Lipase 23 11 - 51 U/L    Comment: Performed at [facility], [address] [place], [place] [address]  Comprehensive metabolic panel     Status: Abnormal   Collection Time: 04/19/2018  2:13 PM  Result Value Ref Range   Sodium 141 135 - 145 mmol/L   Potassium 3.2 (L) 3.5 - 5.1 mmol/L   Chloride 105 98 - 111 mmol/L   CO2 26 22 - 32 mmol/L   Glucose, Bld 187 (H) 70 - 99 mg/dL   BUN 29 (H) 8 - 23 mg/dL   Creatinine, Ser 0.96 0.44 - 1.00 mg/dL   Calcium 9.4 8.9 - 10.3 mg/dL   Total Protein 6.5 6.5 - 8.1 g/dL   Albumin 3.8 3.5 - 5.0 g/dL   AST 44 (H) 15 - 41 U/L   ALT 39 0 - 44 U/L   Alkaline Phosphatase 81 38 - 126 U/L   Total Bilirubin 0.9 0.3 - 1.2 mg/dL   GFR calc non Af Amer 51 (L) >60 mL/min   GFR calc Af Amer 59 (L) >60 mL/min   Anion gap 10 5 -  15    Comment: Performed at [facility], [address] [place], [place] [address]  CBC     Status: Abnormal   Collection Time: 04/01/2018  2:13 PM  Result Value Ref Range   WBC 10.1 4.0 - 10.5 K/uL   RBC 3.94 3.87 - 5.11 MIL/uL   Hemoglobin 11.3 (L) 12.0 - 15.0 g/dL   HCT 35.4 (L) 36.0 - 46.0 %   MCV 89.8 80.0 - 100.0 fL   MCH 28.7 26.0 - 34.0 pg   MCHC 31.9 30.0 - 36.0 g/dL   RDW 13.5 11.5 - 15.5 %   Platelets 177 150 - 400 K/uL   nRBC 0.0 0.0 - 0.2 %    Comment: Performed at [facility], [address] [place], [place] [address]  Gastrointestinal Panel by PCR , Stool     Status: None   Collection Time: 04/13/2018  2:21 PM  Result Value Ref Range   Campylobacter species NOT DETECTED NOT DETECTED   Plesimonas shigelloides NOT DETECTED NOT DETECTED   Salmonella species NOT DETECTED NOT DETECTED   Yersinia enterocolitica NOT DETECTED NOT DETECTED   Vibrio species NOT DETECTED NOT  DETECTED   Vibrio cholerae NOT DETECTED NOT DETECTED   Enteroaggregative E coli (EAEC) NOT DETECTED NOT DETECTED   Enteropathogenic E coli (EPEC) NOT DETECTED NOT DETECTED   Enterotoxigenic E coli (ETEC) NOT DETECTED NOT DETECTED   Shiga like toxin producing E coli (STEC) NOT DETECTED NOT DETECTED   Shigella/Enteroinvasive E coli (EIEC) NOT DETECTED NOT DETECTED   Cryptosporidium NOT DETECTED NOT DETECTED   Cyclospora cayetanensis NOT DETECTED NOT DETECTED   Entamoeba histolytica NOT DETECTED NOT DETECTED   Giardia lamblia NOT DETECTED NOT DETECTED   Adenovirus F40/41 NOT DETECTED NOT DETECTED   Astrovirus NOT DETECTED NOT DETECTED   Norovirus GI/GII NOT DETECTED NOT DETECTED   Rotavirus A NOT DETECTED NOT DETECTED   Sapovirus (I, II, IV, and V) NOT DETECTED NOT DETECTED    Comment: Performed at [facility], [address] [place], [place] [address]  C difficile quick scan w PCR reflex     Status: None   Collection Time: 04/07/2018  2:21 PM  Result Value Ref Range   C Diff antigen NEGATIVE NEGATIVE   C Diff toxin NEGATIVE NEGATIVE   C Diff interpretation No C. difficile detected.     Comment: Performed at [facility], [address] [place], [place] [address]  Urinalysis, Complete w Microscopic     Status: Abnormal   Collection Time: 03/30/2018  4:15 PM  Result Value Ref Range   Color, Urine YELLOW (A) YELLOW   APPearance HAZY (A) CLEAR   Specific Gravity, Urine 1.013 1.005 - 1.030   pH 7.0 5.0 - 8.0   Glucose, UA NEGATIVE NEGATIVE mg/dL   Hgb urine dipstick NEGATIVE NEGATIVE   Bilirubin Urine NEGATIVE NEGATIVE   Ketones, ur NEGATIVE NEGATIVE mg/dL   Protein, ur 30 (A) NEGATIVE mg/dL   Nitrite POSITIVE (A) NEGATIVE   Leukocytes, UA NEGATIVE NEGATIVE   RBC / HPF 0-5 0 - 5 RBC/hpf   WBC, UA 6-10 0 - 5 WBC/hpf   Bacteria, UA MANY (A) NONE SEEN   Squamous Epithelial / LPF 0-5 0 - 5   Mucus PRESENT     Comment: Performed at [facility], [place], [place] [address]   Ct Abdomen Pelvis Wo Contrast  Result Date: 03/27/2018 CLINICAL DATA:  Abdominal pain and diarrhea. EXAM: CT  ABDOMEN AND PELVIS WITHOUT CONTRAST TECHNIQUE: Multidetector CT imaging of the abdomen and pelvis was performed following the standard protocol without IV contrast. COMPARISON:  08/29/2014 FINDINGS: Lower chest: Heart is enlarged. Mitral annular calcification evident. Coronary artery calcification is evident. Tiny right pleural effusion noted Hepatobiliary: No focal abnormality in the liver on this study without intravenous contrast. There is no evidence for gallstones, gallbladder wall thickening, or pericholecystic fluid. No intrahepatic or extrahepatic biliary dilation. Pancreas: No focal mass lesion. No dilatation of the main duct. No intraparenchymal cyst. No peripancreatic edema. Spleen: No splenomegaly. No focal mass lesion. Adrenals/Urinary Tract: No adrenal nodule or mass. 10 mm exophytic lesion upper pole right kidney has attenuation too high to be a simple cyst. This was present on the prior study and measured 9 mm at that time suggesting no substantial interval change and likely a cyst complicated by proteinaceous debris or hemorrhage. Similar 4 mm lesion identified interpolar right kidney . Simple cysts are noted in the kidneys bilaterally similar to prior. 4 mm hyperattenuating focus lower pole left kidney is indeterminate. No hydronephrosis. No evidence for hydroureter. The urinary bladder appears normal for the degree of distention. Stomach/Bowel: Stomach is nondistended. No gastric wall thickening. No evidence of outlet obstruction. Duodenum is normally positioned as is the ligament of Treitz. No small bowel wall thickening. No small bowel dilatation. The terminal ileum is normal. The appendix is not visualized, but there is no edema or inflammation in the region of the cecum. No gross colonic mass. No colonic wall thickening. Diverticular changes are  noted in the left colon without evidence of diverticulitis. Vascular/Lymphatic: There is abdominal aortic atherosclerosis without aneurysm. Right renal artery stent device noted. There is no gastrohepatic or hepatoduodenal ligament lymphadenopathy. No intraperitoneal or retroperitoneal lymphadenopathy. No pelvic sidewall lymphadenopathy. Reproductive: Uterus surgically absent.  There is no adnexal mass. Other: No intraperitoneal free fluid. Musculoskeletal: No worrisome lytic or sclerotic osseous abnormality. IMPRESSION: 1. No acute findings in the abdomen or pelvis. Specifically, no findings to explain the patient's history of abdominal pain and diarrhea. 2. Left colonic diverticulosis without diverticulitis. 3. Tiny right pleural effusion, stable. 4.  Aortic Atherosclerois (ICD10-170.0) Electronically Signed   By: Misty Stanley M.D.   On: 03/21/2018 17:03    Pending Labs [employer] (From admission, onward)    Start     Ordered   Signed and Held  CBC  (enoxaparin (LOVENOX)    CrCl >/= 30 ml/min)  Once,   R    Comments:  Baseline for enoxaparin therapy IF NOT ALREADY DRAWN.  Notify MD if PLT < 100 K.    Signed and Held   Signed and Held  Creatinine, serum  (enoxaparin (LOVENOX)    CrCl >/= 30 ml/min)  Once,   R    Comments:  Baseline for enoxaparin therapy IF NOT ALREADY DRAWN.    Signed and Held   Signed and Held  Creatinine, serum  (enoxaparin (LOVENOX)    CrCl >/= 30 ml/min)  Weekly,   R    Comments:  while on enoxaparin therapy    Signed and Held   Signed and Held  TSH  Once,   R     Signed and Held   Signed and Held  Urine culture  Once,   R     Signed and Held          Vitals/Pain Today's Vitals   03/28/2018 1500 04/08/2018 1530 03/22/2018 1600 03/24/2018 1947  BP: (!) 190/97 (!) 198/130 Marland Kitchen)  199/90 (!) 190/98  Pulse: 78 91 86 86  Resp:   18   Temp:      SpO2: 97% 99% 97% 97%  Weight:      Height:      PainSc:        Isolation Precautions Enteric precautions (UV  disinfection)  Medications Medications  ciprofloxacin (CIPRO) IVPB 200 mg (has no administration in time range)  loperamide (IMODIUM) capsule 4 mg (has no administration in time range)  potassium chloride SA (K-DUR,KLOR-CON) CR tablet 40 mEq (has no administration in time range)  0.9 %  sodium chloride infusion ( Intravenous Stopped 03/27/2018 1750)  ondansetron (ZOFRAN) injection 4 mg (4 mg Intravenous Given 03/28/2018 1612)  ciprofloxacin (CIPRO) IVPB 400 mg (0 mg Intravenous Stopped 03/24/2018 1908)    Mobility Ambulatory

## 2018-03-31 NOTE — Progress Notes (Signed)
Pharmacist - Prescriber Communication  Ciprofloxacin dose modified from 200 mg IV Q12H to 400 mg IV Q12H due to CrCl greater than 30 mL/min.   Pleshette Tomasini A. Jordan Hawks, PharmD, BCPS Clinical Pharmacist 04/19/2018 20:46

## 2018-03-31 NOTE — ED Notes (Signed)
Occult blood card pos

## 2018-03-31 NOTE — Progress Notes (Signed)
Advanced care plan.  Purpose of the Encounter: CODE STATUS  Parties in Attendance: Patient herself  Patient's Decision Capacity: Intact  Subjective/Patient's story:  Patient is 85 year old white female with history of hypertension, peripheral vascular disease, coronary artery disease presents with abdominal pain diarrhea  Objective/Medical story Discussed with the patient regarding her desires for cardiac and pulmonary resuscitation   Goals of care determination:   Patient states that she would not like to be resuscitated. CODE STATUS:  DNR  Time spent discussing advanced care planning: 16 minutes

## 2018-03-31 NOTE — H&P (Signed)
[facility] [place] NAME: Lori Roberts    MR#:  [id number]  DATE OF BIRTH:  12-Jan-1922  DATE OF ADMISSION:  03/28/2018  PRIMARY CARE PHYSICIAN: Cletis Athens, MD   REQUESTING/REFERRING PHYSICIAN: Earleen Newport, MD  CHIEF COMPLAINT:   Chief Complaint  Patient presents with  . Abdominal Pain  . Nausea  . Diarrhea    HISTORY OF PRESENT ILLNESS: Lori Roberts  is a 85 y.o. female with a known history of osteoarthritis, atrial fibrillation, GERD, glaucoma, hypertension, hyperlipidemia, peripheral vascular disease who is presenting to the emergency room with complaint of abdominal pain ongoing for the past few days.  Patient states that she started having abdominal pain in the left lower quadrant and is dull aching type of pain constant in nature.  Patient also had severe diarrhea for 2 days and diarrhea is now decreased.  She also had nausea vomiting.  Patient states that she has been eating or drinking much and lives alone and appears very dehydrated in the ER.  Denies any fevers chills no chest pain palpitations. PAST MEDICAL HISTORY:   Past Medical History:  Diagnosis Date  . Arthritis   . Atrial fibrillation ([place])   . GERD (gastroesophageal reflux disease)   . Glaucoma   . HBP (high blood pressure)   . Heart trouble   . Hyperlipidemia   . PVD (peripheral vascular disease) ([place])     PAST SURGICAL HISTORY:  Past Surgical History:  Procedure Laterality Date  . ABDOMINAL HYSTERECTOMY    . APPENDECTOMY    . CEREBRAL ANEURYSM REPAIR    . DILATION AND CURETTAGE OF UTERUS    . FEMORAL-FEMORAL BYPASS GRAFT Bilateral 08/29/2014   Procedure: bilateral aorta iliac thrombectomy, thromboembolectomy, bilateral femoral thromboembolectomy;  Surgeon: Serafina Mitchell, MD;  Location: [facility] ORS;  Service: Vascular;  Laterality: Bilateral;  . KIDNEY STENT    . TUBAL LIGATION      SOCIAL HISTORY:  Social History   Tobacco Use  . Smoking status:  Never Smoker  . Smokeless tobacco: Never Used  Substance Use Topics  . Alcohol use: No    FAMILY HISTORY:  Family History  Problem Relation Age of Onset  . Lung cancer Mother   . Cerebral aneurysm Father     DRUG ALLERGIES:  Allergies  Allergen Reactions  . Mebaral [Mephobarbital] Rash and Shortness Of Breath  . Wool Alcohol [Lanolin] Swelling  . Azor [Amlodipine-Olmesartan] Rash  . Clindamycin/Lincomycin Rash  . Iodine Rash  . Penicillins Rash  . Sulfa Antibiotics Rash  . Sulfamethoxazole-Trimethoprim Rash    REVIEW OF SYSTEMS:   CONSTITUTIONAL: No fever, fatigue or weakness.  EYES: No blurred or double vision.  EARS, NOSE, AND THROAT: No tinnitus or ear pain.  RESPIRATORY: No cough, shortness of breath, wheezing or hemoptysis.  CARDIOVASCULAR: No chest pain, orthopnea, edema.  GASTROINTESTINAL: Positive nausea, positive vomiting, positive diarrhea or positive abdominal pain.  GENITOURINARY: No dysuria, hematuria.  ENDOCRINE: No polyuria, nocturia,  HEMATOLOGY: No anemia, easy bruising or bleeding SKIN: No rash or lesion. MUSCULOSKELETAL: No joint pain or arthritis.   NEUROLOGIC: No tingling, numbness, weakness.  PSYCHIATRY: No anxiety or depression.   MEDICATIONS AT HOME:  Prior to Admission medications   Medication Sig Start Date End Date Taking? Authorizing Provider  dorzolamide-timolol (COSOPT) 22.3-6.8 MG/ML ophthalmic solution Place 1 drop into both eyes 3 (three) times daily.   Yes [provider]  Travoprost, BAK Free, (TRAVATAN) 0.004 % SOLN ophthalmic solution  Place 1 drop into both eyes at bedtime.   Yes [provider]  amLODipine (NORVASC) 5 MG tablet Take 5 mg by mouth daily.    [provider]  atorvastatin (LIPITOR) 20 MG tablet Take 20 mg by mouth daily.    [provider]  bumetanide (BUMEX) 0.5 MG tablet Take 0.5 mg by mouth daily.    [provider]  clopidogrel (PLAVIX) 75 MG tablet Take 75 mg by  mouth daily.    [provider]  feeding supplement, ENSURE ENLIVE, (ENSURE ENLIVE) LIQD Take 237 mLs by mouth 3 (three) times daily between meals. 09/06/14   Theodoro Grist, MD  ferrous sulfate 325 (65 FE) MG tablet Take 1 tablet (325 mg total) by mouth 2 (two) times daily with a meal. 09/06/14   Theodoro Grist, MD  hydrALAZINE (APRESOLINE) 25 MG tablet Take 25 mg by mouth daily.    [provider]  hydrOXYzine (ATARAX/VISTARIL) 25 MG tablet Take 1 tablet (25 mg total) by mouth at bedtime as needed for itching. 03/10/18   Coral Spikes, DO  isosorbide mononitrate (IMDUR) 30 MG 24 hr tablet  10/27/17   [provider]  loperamide (IMODIUM) 2 MG capsule Take 1 capsule (2 mg total) by mouth as needed for diarrhea or loose stools. 08/17/14   Earleen Newport, MD  olmesartan (BENICAR) 40 MG tablet Take 40 mg by mouth daily.    [provider]  omeprazole (PRILOSEC) 20 MG capsule  09/08/17   [provider]  pantoprazole (PROTONIX) 40 MG tablet Take 1 tablet (40 mg total) by mouth daily. 08/17/14 08/17/15  Earleen Newport, MD  predniSONE (DELTASONE) 10 MG tablet 50 mg daily x 2 days, then 40 mg daily x 2 days, then 30 mg daily x 2 days, then 20 mg daily x 2 days, then 10 mg daily x 2 days. 03/10/18   Coral Spikes, DO  solifenacin (VESICARE) 5 MG tablet Take 5 mg by mouth daily.    [provider]  sotalol (BETAPACE) 120 MG tablet Take 120 mg by mouth 2 (two) times daily.    [provider]      PHYSICAL EXAMINATION:   VITAL SIGNS: Blood pressure (!) 199/90, pulse 86, temperature 98.5 F (36.9 C), resp. rate 18, height 5\' 6"  (1.676 m), weight 54 kg, SpO2 97 %.  GENERAL:  85 y.o.-year-old patient lying in the bed with no acute distress.  EYES: Pupils equal, round, reactive to light and accommodation. No scleral icterus. Extraocular muscles intact.  HEENT: Head atraumatic, normocephalic. Oropharynx and nasopharynx clear.  NECK:  Supple,  no jugular venous distention. No thyroid enlargement, no tenderness.  LUNGS: Normal breath sounds bilaterally, no wheezing, rales,rhonchi or crepitation. No use of accessory muscles of respiration.  CARDIOVASCULAR: S1, S2 normal.  Systolic murmurs, rubs, or gallops.  ABDOMEN: Soft, nontender, nondistended. Bowel sounds present. No organomegaly or mass.  EXTREMITIES: No pedal edema, cyanosis, or clubbing.  NEUROLOGIC: Cranial nerves II through XII are intact. Muscle strength 5/5 in all extremities. Sensation intact. Gait not checked.  PSYCHIATRIC: The patient is alert and oriented x 3.  SKIN: No obvious rash, lesion, or ulcer.   LABORATORY PANEL:   CBC Recent Labs  Lab 04/12/2018 1413  WBC 10.1  HGB 11.3*  HCT 35.4*  PLT 177  MCV 89.8  MCH 28.7  MCHC 31.9  RDW 13.5   ------------------------------------------------------------------------------------------------------------------  Chemistries  Recent Labs  Lab 04/10/2018 1413  NA 141  K 3.2*  CL 105  CO2 26  GLUCOSE 187*  BUN 29*  CREATININE 0.96  CALCIUM 9.4  AST 44*  ALT 39  ALKPHOS 81  BILITOT 0.9   ------------------------------------------------------------------------------------------------------------------ estimated creatinine clearance is 30.5 mL/min (by C-G formula based on SCr of 0.96 mg/dL). ------------------------------------------------------------------------------------------------------------------ No results for input(s): TSH, T4TOTAL, T3FREE, THYROIDAB in the last 72 hours.  Invalid input(s): FREET3   Coagulation profile No results for input(s): INR, PROTIME in the last 168 hours. ------------------------------------------------------------------------------------------------------------------- No results for input(s): DDIMER in the last 72 hours. -------------------------------------------------------------------------------------------------------------------  Cardiac Enzymes No results for  input(s): CKMB, TROPONINI, MYOGLOBIN in the last 168 hours.  Invalid input(s): CK ------------------------------------------------------------------------------------------------------------------ Invalid input(s): POCBNP  ---------------------------------------------------------------------------------------------------------------  Urinalysis    Component Value Date/Time   COLORURINE YELLOW (A) 04/07/2018 1615   APPEARANCEUR HAZY (A) 04/06/2018 1615   APPEARANCEUR Clear 01/10/2014 1649   LABSPEC 1.013 03/26/2018 1615   LABSPEC 1.016 01/10/2014 1649   PHURINE 7.0 03/28/2018 1615   GLUCOSEU NEGATIVE 03/29/2018 1615   GLUCOSEU Negative 01/10/2014 1649   HGBUR NEGATIVE 04/06/2018 1615   BILIRUBINUR NEGATIVE 04/01/2018 1615   BILIRUBINUR Negative 01/10/2014 1649   KETONESUR NEGATIVE 03/27/2018 1615   PROTEINUR 30 (A) 04/11/2018 1615   NITRITE POSITIVE (A) 04/06/2018 1615   LEUKOCYTESUR NEGATIVE 03/30/2018 1615   LEUKOCYTESUR Negative 01/10/2014 1649     RADIOLOGY: Ct Abdomen Pelvis Wo Contrast  Result Date: 04/15/2018 CLINICAL DATA:  Abdominal pain and diarrhea. EXAM: CT ABDOMEN AND PELVIS WITHOUT CONTRAST TECHNIQUE: Multidetector CT imaging of the abdomen and pelvis was performed following the standard protocol without IV contrast. COMPARISON:  08/29/2014 FINDINGS: Lower chest: Heart is enlarged. Mitral annular calcification evident. Coronary artery calcification is evident. Tiny right pleural effusion noted Hepatobiliary: No focal abnormality in the liver on this study without intravenous contrast. There is no evidence for gallstones, gallbladder wall thickening, or pericholecystic fluid. No intrahepatic or extrahepatic biliary dilation. Pancreas: No focal mass lesion. No dilatation of the main duct. No intraparenchymal cyst. No peripancreatic edema. Spleen: No splenomegaly. No focal mass lesion. Adrenals/Urinary Tract: No adrenal nodule or mass. 10 mm exophytic lesion upper pole  right kidney has attenuation too high to be a simple cyst. This was present on the prior study and measured 9 mm at that time suggesting no substantial interval change and likely a cyst complicated by proteinaceous debris or hemorrhage. Similar 4 mm lesion identified interpolar right kidney . Simple cysts are noted in the kidneys bilaterally similar to prior. 4 mm hyperattenuating focus lower pole left kidney is indeterminate. No hydronephrosis. No evidence for hydroureter. The urinary bladder appears normal for the degree of distention. Stomach/Bowel: Stomach is nondistended. No gastric wall thickening. No evidence of outlet obstruction. Duodenum is normally positioned as is the ligament of Treitz. No small bowel wall thickening. No small bowel dilatation. The terminal ileum is normal. The appendix is not visualized, but there is no edema or inflammation in the region of the cecum. No gross colonic mass. No colonic wall thickening. Diverticular changes are noted in the left colon without evidence of diverticulitis. Vascular/Lymphatic: There is abdominal aortic atherosclerosis without aneurysm. Right renal artery stent device noted. There is no gastrohepatic or hepatoduodenal ligament lymphadenopathy. No intraperitoneal or retroperitoneal lymphadenopathy. No pelvic sidewall lymphadenopathy. Reproductive: Uterus surgically absent.  There is no adnexal mass. Other: No intraperitoneal free fluid. Musculoskeletal: No worrisome lytic or sclerotic osseous abnormality. IMPRESSION: 1. No acute findings in the abdomen or pelvis. Specifically, no findings to explain the patient's history of abdominal pain  and diarrhea. 2. Left colonic diverticulosis without diverticulitis. 3. Tiny right pleural effusion, stable. 4.  Aortic Atherosclerois (ICD10-170.0) Electronically Signed   By: Misty Stanley M.D.   On: 03/21/2018 17:03    EKG: Orders placed or performed during the hospital encounter of 08/29/14  . EKG 12-Lead  . EKG  12-Lead  . EKG 12-Lead  . EKG 12-Lead  . EKG    IMPRESSION AND PLAN: Patient is 85 year old presenting with abdominal pain diarrhea  1.  Abdominal pain and diarrhea etiology unclear CT scan of the abdomen is neck Stool for C. difficile was negative  I will place patient on Imodium schedule Give her IV fluids  2.  UTI we will treated with IV Cipro obtain urine culture  3.  Hypertension we will continue amlodipine hydralazine and Benicar  4.  Peripheral vascular disease continue Plavix  5.  Coronary artery disease continue Imdur  6.  Blood in the stool monitor CBC currently appears to be very mild likely due to internal hemorrhoids from the diarrhea  7.  Hypokalemia replace potassium  8.  GERD we will place patient on PPIs  9.  DVT prophylaxis with SCDs   All the records are reviewed and case discussed with ED provider. Management plans discussed with the patient, family and they are in agreement.  CODE STATUS: Code Status History    Date Active Date Inactive Code Status Order ID Comments User Context   08/30/2014 0216 09/06/2014 1625 Full Code [id number]  Serafina Mitchell, MD Inpatient    Advance Directive Documentation     Most Recent Value  Type of Advance Directive  Healthcare Power of Attorney, Living will  Pre-existing out of facility DNR order (yellow form or pink MOST form)  -  "MOST" Form in Place?  -       TOTAL TIME TAKING CARE OF THIS PATIENT:58minutes.    Dustin Flock M.D on 04/17/2018 at 6:17 PM  Between 7am to 6pm - Pager - [phone]  After 6pm go to [email or web address] - password [email or web address] [facility]  [phone]  CC: Primary care physician; Cletis Athens, MD

## 2018-04-01 DIAGNOSIS — R109 Unspecified abdominal pain: Secondary | ICD-10-CM | POA: Diagnosis not present

## 2018-04-01 DIAGNOSIS — I739 Peripheral vascular disease, unspecified: Secondary | ICD-10-CM | POA: Diagnosis not present

## 2018-04-01 DIAGNOSIS — I2510 Atherosclerotic heart disease of native coronary artery without angina pectoris: Secondary | ICD-10-CM | POA: Diagnosis not present

## 2018-04-01 DIAGNOSIS — N390 Urinary tract infection, site not specified: Secondary | ICD-10-CM | POA: Diagnosis not present

## 2018-04-01 DIAGNOSIS — E43 Unspecified severe protein-calorie malnutrition: Secondary | ICD-10-CM

## 2018-04-01 MED ORDER — CIPROFLOXACIN HCL 500 MG PO TABS
500.0000 mg | ORAL_TABLET | Freq: Two times a day (BID) | ORAL | Status: DC
  Administered 2018-04-01 – 2018-04-02 (×3): 500 mg via ORAL
  Filled 2018-04-01 (×3): qty 1

## 2018-04-01 MED ORDER — ENSURE ENLIVE PO LIQD
237.0000 mL | Freq: Two times a day (BID) | ORAL | Status: DC
  Administered 2018-04-02 (×2): 237 mL via ORAL

## 2018-04-01 MED ORDER — MORPHINE SULFATE (PF) 2 MG/ML IV SOLN
2.0000 mg | INTRAVENOUS | Status: DC | PRN
  Administered 2018-04-01: 2 mg via INTRAVENOUS
  Filled 2018-04-01: qty 1

## 2018-04-01 MED ORDER — HYDRALAZINE HCL 20 MG/ML IJ SOLN
10.0000 mg | Freq: Four times a day (QID) | INTRAMUSCULAR | Status: DC | PRN
  Administered 2018-04-01 – 2018-04-02 (×2): 10 mg via INTRAVENOUS
  Filled 2018-04-01 (×2): qty 1

## 2018-04-01 MED ORDER — OXYCODONE-ACETAMINOPHEN 5-325 MG PO TABS
1.0000 | ORAL_TABLET | ORAL | Status: DC | PRN
  Administered 2018-04-01 – 2018-04-02 (×4): 1 via ORAL
  Filled 2018-04-01 (×5): qty 1

## 2018-04-01 MED ORDER — ADULT MULTIVITAMIN W/MINERALS CH
1.0000 | ORAL_TABLET | Freq: Every day | ORAL | Status: DC
  Administered 2018-04-02: 1 via ORAL
  Filled 2018-04-01: qty 1

## 2018-04-01 NOTE — Progress Notes (Signed)
Pt still complain of abdominal pain per MD okay for RN to order 2 mg of morphine prn q4.

## 2018-04-01 NOTE — Care Management Obs Status (Signed)
[facility] NOTIFICATION   Patient Details  Name: Lori Roberts MRN: [id number] Date of Birth: 1921/08/30   Medicare Observation Status Notification Given:  Yes    Upton Russey A Brytnee Bechler, RN 04/01/2018, 11:11 AM

## 2018-04-01 NOTE — Progress Notes (Signed)
PHARMACIST - PHYSICIAN COMMUNICATION DR:   Margaretmary Eddy CONCERNING: Antibiotic IV to Oral Route Change Policy  RECOMMENDATION: This patient is receiving ciprofloxacin by the intravenous route.  Based on criteria approved by the Pharmacy and Therapeutics Committee, the antibiotic(s) is/are being converted to the equivalent oral dose form(s).   DESCRIPTION: These criteria include:  Patient being treated for a respiratory tract infection, urinary tract infection, cellulitis or clostridium difficile associated diarrhea if on metronidazole  The patient is not neutropenic and does not exhibit a GI malabsorption state  The patient is eating (either orally or via tube) and/or has been taking other orally administered medications for a least 24 hours  The patient is improving clinically and has a Tmax < 100.5  If you have questions about this conversion, please contact the Pharmacy Department  []   [phone] )  [facility] [x]   [phone] )  [facility] []   [phone] )  [facility] []   [phone] )  [facility] []   [phone] )  [facility]

## 2018-04-01 NOTE — Progress Notes (Signed)
Initial Nutrition Assessment  DOCUMENTATION CODES:   Severe malnutrition in context of social or environmental circumstances  INTERVENTION:   Ensure Enlive po BID, each supplement provides 350 kcal and 20 grams of protein  MVI daily  Liberalize diet   Suspect pt likely at refeed risk; recommend monitor K, Mg and P labs once oral intake improves.  NUTRITION DIAGNOSIS:   Severe Malnutrition related to social / environmental circumstances(advanced age ) as evidenced by moderate fat depletion, moderate to severe muscle depletions.  GOAL:   Patient will meet greater than or equal to 90% of their needs  MONITOR:   PO intake, Supplement acceptance, Labs, Weight trends, Skin, I & O's  REASON FOR ASSESSMENT:   Malnutrition Screening Tool    ASSESSMENT:   85 y.o. female with a known history of osteoarthritis, atrial fibrillation, GERD, glaucoma, hypertension, hyperlipidemia, peripheral vascular disease who is presenting to the emergency room with complaint of abdominal pain ongoing for the past few days.   RD visited pt's room today. Pt sleeping with vomit bag in lap. RD did not wake pt; minimal exam obtained. Pt with continued abdominal pain and nausea today. RD will add supplements and MVI to help pt meet her estimated needs. Will start supplements tomorrow. There is limited weight history in chart, but pt appears to have lost ~15lbs over the past 16 months; RD unsure how recent weight loss occurred. RD will liberalize diet and add supplements. Will obtain nutrition related history at follow up. Suspect pt likely at refeed risk; recommend monitor K, Mg and P labs once oral intake improves.  Medications reviewed and include: ciprofloxacin, lovenox, protonix, NaCl @75ml /hr, zofran  Labs reviewed: K 3.2(L), BUN 29(H)  NUTRITION - FOCUSED PHYSICAL EXAM:    Most Recent Value  Orbital Region  Moderate depletion  Upper Arm Region  Moderate depletion  Thoracic and Lumbar Region   Moderate depletion  Buccal Region  Moderate depletion  Temple Region  Severe depletion  Clavicle Bone Region  Severe depletion  Clavicle and Acromion Bone Region  Severe depletion  Scapular Bone Region  Unable to assess  Dorsal Hand  Severe depletion  Patellar Region  Unable to assess  Anterior Thigh Region  Unable to assess  Posterior Calf Region  Unable to assess  Edema (RD Assessment)  None  Hair  Reviewed  Eyes  Reviewed  Mouth  Reviewed  Skin  Reviewed  Nails  Reviewed     Diet Order:   Diet Order            Diet Heart Room service appropriate? Yes; Fluid consistency: Thin  Diet effective now             EDUCATION NEEDS:   No education needs have been identified at this time  Skin:  Skin Assessment: Reviewed RN Assessment  Last BM:  1/12- TYPE 6  Height:   Ht Readings from Last 1 Encounters:  03/29/2018 5\' 6"  (1.676 m)    Weight:   Wt Readings from Last 1 Encounters:  04/04/2018 54 kg    Ideal Body Weight:  59 kg  BMI:  Body mass index is 19.21 kg/m.  Estimated Nutritional Needs:   Kcal:  1300-1500kcal/day   Protein:  70-80g/day   Fluid:  1.3L/day   Koleen Distance MS, RD, LDN Pager #- [phone] Office#- [phone] After Hours Pager: [phone]

## 2018-04-01 NOTE — Progress Notes (Signed)
Per MD okay for RN to place order oxycodone prn Q4, and PRN hydralazine 10mg  iv.

## 2018-04-01 NOTE — Progress Notes (Addendum)
[place] at [place] NAME: Lori Roberts    MR#:  [id number]  DATE OF BIRTH:  03-18-1922  SUBJECTIVE:  CHIEF COMPLAINT:  Endorsing lower abd pain   REVIEW OF SYSTEMS:  CONSTITUTIONAL: No fever, fatigue or weakness.  EYES: No blurred or double vision.  EARS, NOSE, AND THROAT: No tinnitus or ear pain.  RESPIRATORY: No cough, shortness of breath, wheezing or hemoptysis.  CARDIOVASCULAR: No chest pain, orthopnea, edema.  GASTROINTESTINAL: No nausea, vomiting, diarrhea or abdominal pain.  GENITOURINARY: No dysuria, hematuria.  ENDOCRINE: No polyuria, nocturia,  HEMATOLOGY: No anemia, easy bruising or bleeding SKIN: No rash or lesion. MUSCULOSKELETAL: No joint pain or arthritis.   NEUROLOGIC: No tingling, numbness, weakness.  PSYCHIATRY: No anxiety or depression.   DRUG ALLERGIES:   Allergies  Allergen Reactions  . Mebaral [Mephobarbital] Rash and Shortness Of Breath  . Wool Alcohol [Lanolin] Swelling  . Azor [Amlodipine-Olmesartan] Rash  . Clindamycin/Lincomycin Rash  . Iodine Rash  . Penicillins Rash  . Sulfa Antibiotics Rash  . Sulfamethoxazole-Trimethoprim Rash    VITALS:  Blood pressure (!) 147/81, pulse 75, temperature 98.4 F (36.9 C), temperature source Oral, resp. rate 17, height 5\' 6"  (1.676 m), weight 54 kg, SpO2 97 %.  PHYSICAL EXAMINATION:  GENERAL:  85 y.o.-year-old patient lying in the bed with no acute distress.  EYES: Pupils equal, round, reactive to light and accommodation. No scleral icterus. Extraocular muscles intact.  HEENT: Head atraumatic, normocephalic. Oropharynx and nasopharynx clear.  NECK:  Supple, no jugular venous distention. No thyroid enlargement, no tenderness.  LUNGS: Normal breath sounds bilaterally, no wheezing, rales,rhonchi or crepitation. No use of accessory muscles of respiration.  CARDIOVASCULAR: S1, S2 normal. No murmurs, rubs, or gallops.  ABDOMEN: Soft, nontender, nondistended.  Bowel sounds present. No organomegaly or mass.  EXTREMITIES: No pedal edema, cyanosis, or clubbing.  NEUROLOGIC: Cranial nerves II through XII are intact. Muscle strength 5/5 in all extremities. Sensation intact. Gait not checked.  PSYCHIATRIC: The patient is alert and oriented x 3.  SKIN: No obvious rash, lesion, or ulcer.    LABORATORY PANEL:   CBC Recent Labs  Lab 03/30/2018 1413  WBC 10.1  HGB 11.3*  HCT 35.4*  PLT 177   ------------------------------------------------------------------------------------------------------------------  Chemistries  Recent Labs  Lab 03/30/2018 1413  NA 141  K 3.2*  CL 105  CO2 26  GLUCOSE 187*  BUN 29*  CREATININE 0.96  CALCIUM 9.4  AST 44*  ALT 39  ALKPHOS 81  BILITOT 0.9   ------------------------------------------------------------------------------------------------------------------  Cardiac Enzymes No results for input(s): TROPONINI in the last 168 hours. ------------------------------------------------------------------------------------------------------------------  RADIOLOGY:  Ct Abdomen Pelvis Wo Contrast  Result Date: 04/17/2018 CLINICAL DATA:  Abdominal pain and diarrhea. EXAM: CT ABDOMEN AND PELVIS WITHOUT CONTRAST TECHNIQUE: Multidetector CT imaging of the abdomen and pelvis was performed following the standard protocol without IV contrast. COMPARISON:  08/29/2014 FINDINGS: Lower chest: Heart is enlarged. Mitral annular calcification evident. Coronary artery calcification is evident. Tiny right pleural effusion noted Hepatobiliary: No focal abnormality in the liver on this study without intravenous contrast. There is no evidence for gallstones, gallbladder wall thickening, or pericholecystic fluid. No intrahepatic or extrahepatic biliary dilation. Pancreas: No focal mass lesion. No dilatation of the main duct. No intraparenchymal cyst. No peripancreatic edema. Spleen: No splenomegaly. No focal mass lesion. Adrenals/Urinary  Tract: No adrenal nodule or mass. 10 mm exophytic lesion upper pole right kidney has attenuation too high to be a simple cyst. This was  present on the prior study and measured 9 mm at that time suggesting no substantial interval change and likely a cyst complicated by proteinaceous debris or hemorrhage. Similar 4 mm lesion identified interpolar right kidney . Simple cysts are noted in the kidneys bilaterally similar to prior. 4 mm hyperattenuating focus lower pole left kidney is indeterminate. No hydronephrosis. No evidence for hydroureter. The urinary bladder appears normal for the degree of distention. Stomach/Bowel: Stomach is nondistended. No gastric wall thickening. No evidence of outlet obstruction. Duodenum is normally positioned as is the ligament of Treitz. No small bowel wall thickening. No small bowel dilatation. The terminal ileum is normal. The appendix is not visualized, but there is no edema or inflammation in the region of the cecum. No gross colonic mass. No colonic wall thickening. Diverticular changes are noted in the left colon without evidence of diverticulitis. Vascular/Lymphatic: There is abdominal aortic atherosclerosis without aneurysm. Right renal artery stent device noted. There is no gastrohepatic or hepatoduodenal ligament lymphadenopathy. No intraperitoneal or retroperitoneal lymphadenopathy. No pelvic sidewall lymphadenopathy. Reproductive: Uterus surgically absent.  There is no adnexal mass. Other: No intraperitoneal free fluid. Musculoskeletal: No worrisome lytic or sclerotic osseous abnormality. IMPRESSION: 1. No acute findings in the abdomen or pelvis. Specifically, no findings to explain the patient's history of abdominal pain and diarrhea. 2. Left colonic diverticulosis without diverticulitis. 3. Tiny right pleural effusion, stable. 4.  Aortic Atherosclerois (ICD10-170.0) Electronically Signed   By: Misty Stanley M.D.   On: 04/12/2018 17:03    EKG:   Orders placed or  performed during the hospital encounter of 08/29/14  . EKG 12-Lead  . EKG 12-Lead  . EKG 12-Lead  . EKG 12-Lead  . EKG    ASSESSMENT AND PLAN:    Patient is 85 year old presenting with abdominal pain diarrhea  1.  Abdominal pain and diarrhea etiology unclear  Pain management as needed CT scan of the abdomen with no acute findings Stool for C. difficile was negative, GI panel negative   supportive treatment, IV fluids  2.  UTI we will treated with IV Cipro  Pending urine culture with gram neg rods  3.  Hypertension we will continue amlodipine hydralazine and Benicar  4.  Peripheral vascular disease continue Plavix  5.  Coronary artery disease continue Imdur  6.  Blood in the stool monitor CBC currently appears to be very mild likely due to internal hemorrhoids from the diarrhea  7.  Hypokalemia replace potassium.  Check BMP in a.m.  8.  GERD we will place patient on PPIs  9.  DVT prophylaxis with SCDs    All the records are reviewed and case discussed with Care Management/Social Workerr. Management plans discussed with the patient, family and they are in agreement.  CODE STATUS: dnr  TOTAL TIME TAKING CARE OF THIS PATIENT: 36 minutes.   POSSIBLE D/C IN 1-2 DAYS, DEPENDING ON CLINICAL CONDITION.  Note: This dictation was prepared with Dragon dictation along with smaller phrase technology. Any transcriptional errors that result from this process are unintentional.   Nicholes Mango M.D on 04/01/2018 at 5:49 PM  Between 7am to 6pm - Pager - [phone] After 6pm go to [email or web address] - password [email or web address] [facility]  Office  [phone]  CC: Primary care physician; Cletis Athens, MD

## 2018-04-02 DIAGNOSIS — Z7982 Long term (current) use of aspirin: Secondary | ICD-10-CM | POA: Diagnosis not present

## 2018-04-02 DIAGNOSIS — K219 Gastro-esophageal reflux disease without esophagitis: Secondary | ICD-10-CM | POA: Diagnosis present

## 2018-04-02 DIAGNOSIS — Z90710 Acquired absence of both cervix and uterus: Secondary | ICD-10-CM | POA: Diagnosis not present

## 2018-04-02 DIAGNOSIS — E43 Unspecified severe protein-calorie malnutrition: Secondary | ICD-10-CM | POA: Diagnosis present

## 2018-04-02 DIAGNOSIS — Z681 Body mass index (BMI) 19 or less, adult: Secondary | ICD-10-CM | POA: Diagnosis not present

## 2018-04-02 DIAGNOSIS — I10 Essential (primary) hypertension: Secondary | ICD-10-CM | POA: Diagnosis present

## 2018-04-02 DIAGNOSIS — N3000 Acute cystitis without hematuria: Secondary | ICD-10-CM | POA: Diagnosis present

## 2018-04-02 DIAGNOSIS — Z66 Do not resuscitate: Secondary | ICD-10-CM | POA: Diagnosis present

## 2018-04-02 DIAGNOSIS — K921 Melena: Secondary | ICD-10-CM | POA: Diagnosis present

## 2018-04-02 DIAGNOSIS — I2510 Atherosclerotic heart disease of native coronary artery without angina pectoris: Secondary | ICD-10-CM | POA: Diagnosis present

## 2018-04-02 DIAGNOSIS — I739 Peripheral vascular disease, unspecified: Secondary | ICD-10-CM | POA: Diagnosis present

## 2018-04-02 DIAGNOSIS — E860 Dehydration: Secondary | ICD-10-CM | POA: Diagnosis present

## 2018-04-02 DIAGNOSIS — Z79899 Other long term (current) drug therapy: Secondary | ICD-10-CM | POA: Diagnosis not present

## 2018-04-02 DIAGNOSIS — E876 Hypokalemia: Secondary | ICD-10-CM | POA: Diagnosis present

## 2018-04-02 DIAGNOSIS — B9620 Unspecified Escherichia coli [E. coli] as the cause of diseases classified elsewhere: Secondary | ICD-10-CM | POA: Diagnosis present

## 2018-04-02 DIAGNOSIS — Z7902 Long term (current) use of antithrombotics/antiplatelets: Secondary | ICD-10-CM | POA: Diagnosis not present

## 2018-04-02 DIAGNOSIS — I4891 Unspecified atrial fibrillation: Secondary | ICD-10-CM | POA: Diagnosis present

## 2018-04-02 DIAGNOSIS — E785 Hyperlipidemia, unspecified: Secondary | ICD-10-CM | POA: Diagnosis present

## 2018-04-02 LAB — CBC
HCT: 38.7 % (ref 36.0–46.0)
Hemoglobin: 11.8 g/dL — ABNORMAL LOW (ref 12.0–15.0)
MCH: 27.7 pg (ref 26.0–34.0)
MCHC: 30.5 g/dL (ref 30.0–36.0)
MCV: 90.8 fL (ref 80.0–100.0)
Platelets: 197 10*3/uL (ref 150–400)
RBC: 4.26 MIL/uL (ref 3.87–5.11)
RDW: 14.1 % (ref 11.5–15.5)
WBC: 16.9 10*3/uL — ABNORMAL HIGH (ref 4.0–10.5)
nRBC: 0.1 % (ref 0.0–0.2)

## 2018-04-02 LAB — BASIC METABOLIC PANEL
Anion gap: 10 (ref 5–15)
BUN: 26 mg/dL — ABNORMAL HIGH (ref 8–23)
CO2: 22 mmol/L (ref 22–32)
Calcium: 9.7 mg/dL (ref 8.9–10.3)
Chloride: 107 mmol/L (ref 98–111)
Creatinine, Ser: 0.95 mg/dL (ref 0.44–1.00)
GFR calc Af Amer: 59 mL/min — ABNORMAL LOW (ref >60)
GFR calc non Af Amer: 51 mL/min — ABNORMAL LOW (ref >60)
Glucose, Bld: 161 mg/dL — ABNORMAL HIGH (ref 70–99)
Potassium: 3.4 mmol/L — ABNORMAL LOW (ref 3.5–5.1)
Sodium: 139 mmol/L (ref 135–145)

## 2018-04-02 MED ORDER — BISACODYL 5 MG PO TBEC: 10.0000 mg | DELAYED_RELEASE_TABLET | Freq: Every day | ORAL | Status: DC

## 2018-04-02 MED ORDER — SENNOSIDES-DOCUSATE SODIUM 8.6-50 MG PO TABS
1.0000 | ORAL_TABLET | Freq: Two times a day (BID) | ORAL | Status: DC
  Administered 2018-04-02 (×2): 1 via ORAL
  Filled 2018-04-02 (×2): qty 1

## 2018-04-02 MED ORDER — HYDRALAZINE HCL 50 MG PO TABS: 50.0000 mg | ORAL_TABLET | Freq: Three times a day (TID) | ORAL | Status: DC

## 2018-04-02 MED ORDER — POTASSIUM CHLORIDE CRYS ER 20 MEQ PO TBCR: 40.0000 meq | EXTENDED_RELEASE_TABLET | Freq: Once | ORAL | Status: DC

## 2018-04-02 MED ORDER — HYDRALAZINE HCL 25 MG PO TABS
25.0000 mg | ORAL_TABLET | Freq: Once | ORAL | Status: AC
  Administered 2018-04-02: 25 mg via ORAL
  Filled 2018-04-02: qty 1

## 2018-04-02 MED ORDER — HYDRALAZINE HCL 50 MG PO TABS
50.0000 mg | ORAL_TABLET | Freq: Four times a day (QID) | ORAL | Status: DC
  Administered 2018-04-02 (×2): 50 mg via ORAL
  Filled 2018-04-02 (×2): qty 1

## 2018-04-02 MED ORDER — BISACODYL 5 MG PO TBEC: 10.0000 mg | DELAYED_RELEASE_TABLET | Freq: Every day | ORAL | Status: DC | PRN

## 2018-04-02 MED ORDER — POTASSIUM CHLORIDE CRYS ER 20 MEQ PO TBCR
20.0000 meq | EXTENDED_RELEASE_TABLET | Freq: Once | ORAL | Status: AC
  Administered 2018-04-02: 20 meq via ORAL
  Filled 2018-04-02: qty 1

## 2018-04-03 LAB — URINE CULTURE

## 2018-04-21 NOTE — Progress Notes (Addendum)
[place] at [place] NAME: Lori Roberts    MR#:  [id number]  DATE OF BIRTH:  1921-05-05  SUBJECTIVE:  CHIEF COMPLAINT:  Endorsing  Left lower abd pain   REVIEW OF SYSTEMS:  CONSTITUTIONAL: No fever, fatigue or weakness.  EYES: No blurred or double vision.  EARS, NOSE, AND THROAT: No tinnitus or ear pain.  RESPIRATORY: No cough, shortness of breath, wheezing or hemoptysis.  CARDIOVASCULAR: No chest pain, orthopnea, edema.  GASTROINTESTINAL: No nausea, vomiting, diarrhea or abdominal pain.  GENITOURINARY: No dysuria, hematuria.  ENDOCRINE: No polyuria, nocturia,  HEMATOLOGY: No anemia, easy bruising or bleeding SKIN: No rash or lesion. MUSCULOSKELETAL: No joint pain or arthritis.   NEUROLOGIC: No tingling, numbness, weakness.  PSYCHIATRY: No anxiety or depression.   DRUG ALLERGIES:   Allergies  Allergen Reactions  . Mebaral [Mephobarbital] Rash and Shortness Of Breath  . Wool Alcohol [Lanolin] Swelling  . Azor [Amlodipine-Olmesartan] Rash  . Clindamycin/Lincomycin Rash  . Iodine Rash  . Penicillins Rash  . Sulfa Antibiotics Rash  . Sulfamethoxazole-Trimethoprim Rash    VITALS:  Blood pressure (!) 190/88, pulse 96, temperature 98.5 F (36.9 C), temperature source Oral, resp. rate 18, height 5\' 6"  (1.676 m), weight 54 kg, SpO2 97 %.  PHYSICAL EXAMINATION:  GENERAL:  85 y.o.-year-old patient lying in the bed with no acute distress.  EYES: Pupils equal, round, reactive to light and accommodation. No scleral icterus. Extraocular muscles intact.  HEENT: Head atraumatic, normocephalic. Oropharynx and nasopharynx clear.  NECK:  Supple, no jugular venous distention. No thyroid enlargement, no tenderness.  LUNGS: Normal breath sounds bilaterally, no wheezing, rales,rhonchi or crepitation. No use of accessory muscles of respiration.  CARDIOVASCULAR: S1, S2 normal. No murmurs, rubs, or gallops.  ABDOMEN: Soft, nontender,  nondistended. Bowel sounds present. No organomegaly or mass.  EXTREMITIES: No pedal edema, cyanosis, or clubbing.  NEUROLOGIC: Awake , alert and oriented  Sensation intact. Gait not checked.  PSYCHIATRIC: The patient is alert and oriented x 3.  SKIN: No obvious rash, lesion, or ulcer.    LABORATORY PANEL:   CBC Recent Labs  Lab 2018/04/25 0406  WBC 16.9*  HGB 11.8*  HCT 38.7  PLT 197   ------------------------------------------------------------------------------------------------------------------  Chemistries  Recent Labs  Lab 04/11/2018 1413 Apr 25, 2018 0406  NA 141 139  K 3.2* 3.4*  CL 105 107  CO2 26 22  GLUCOSE 187* 161*  BUN 29* 26*  CREATININE 0.96 0.95  CALCIUM 9.4 9.7  AST 44*  --   ALT 39  --   ALKPHOS 81  --   BILITOT 0.9  --    ------------------------------------------------------------------------------------------------------------------  Cardiac Enzymes No results for input(s): TROPONINI in the last 168 hours. ------------------------------------------------------------------------------------------------------------------  RADIOLOGY:  Ct Abdomen Pelvis Wo Contrast  Result Date: 04/13/2018 CLINICAL DATA:  Abdominal pain and diarrhea. EXAM: CT ABDOMEN AND PELVIS WITHOUT CONTRAST TECHNIQUE: Multidetector CT imaging of the abdomen and pelvis was performed following the standard protocol without IV contrast. COMPARISON:  08/29/2014 FINDINGS: Lower chest: Heart is enlarged. Mitral annular calcification evident. Coronary artery calcification is evident. Tiny right pleural effusion noted Hepatobiliary: No focal abnormality in the liver on this study without intravenous contrast. There is no evidence for gallstones, gallbladder wall thickening, or pericholecystic fluid. No intrahepatic or extrahepatic biliary dilation. Pancreas: No focal mass lesion. No dilatation of the main duct. No intraparenchymal cyst. No peripancreatic edema. Spleen: No splenomegaly. No focal  mass lesion. Adrenals/Urinary Tract: No adrenal nodule or mass. 10 mm  exophytic lesion upper pole right kidney has attenuation too high to be a simple cyst. This was present on the prior study and measured 9 mm at that time suggesting no substantial interval change and likely a cyst complicated by proteinaceous debris or hemorrhage. Similar 4 mm lesion identified interpolar right kidney . Simple cysts are noted in the kidneys bilaterally similar to prior. 4 mm hyperattenuating focus lower pole left kidney is indeterminate. No hydronephrosis. No evidence for hydroureter. The urinary bladder appears normal for the degree of distention. Stomach/Bowel: Stomach is nondistended. No gastric wall thickening. No evidence of outlet obstruction. Duodenum is normally positioned as is the ligament of Treitz. No small bowel wall thickening. No small bowel dilatation. The terminal ileum is normal. The appendix is not visualized, but there is no edema or inflammation in the region of the cecum. No gross colonic mass. No colonic wall thickening. Diverticular changes are noted in the left colon without evidence of diverticulitis. Vascular/Lymphatic: There is abdominal aortic atherosclerosis without aneurysm. Right renal artery stent device noted. There is no gastrohepatic or hepatoduodenal ligament lymphadenopathy. No intraperitoneal or retroperitoneal lymphadenopathy. No pelvic sidewall lymphadenopathy. Reproductive: Uterus surgically absent.  There is no adnexal mass. Other: No intraperitoneal free fluid. Musculoskeletal: No worrisome lytic or sclerotic osseous abnormality. IMPRESSION: 1. No acute findings in the abdomen or pelvis. Specifically, no findings to explain the patient's history of abdominal pain and diarrhea. 2. Left colonic diverticulosis without diverticulitis. 3. Tiny right pleural effusion, stable. 4.  Aortic Atherosclerois (ICD10-170.0) Electronically Signed   By: Misty Stanley M.D.   On: 04/11/2018 17:03     EKG:   Orders placed or performed during the hospital encounter of 08/29/14  . EKG 12-Lead  . EKG 12-Lead  . EKG 12-Lead  . EKG 12-Lead  . EKG    ASSESSMENT AND PLAN:    Patient is 85 year old presenting with abdominal pain diarrhea  1.  Abdominal pain and diarrhea etiology unclear  Pain management as needed CT scan of the abdomen with no acute findings Stool for C. difficile was negative, GI panel negative   supportive treatment, IV fluids  2.  UTI we will treated with  Cipro  Wbc is trending up  Pending urine culture with gram neg rods  3.  Hypertension we will continue amlodipine hydralazine and Benicar Blood pressure elevated hydralazine dose increased titrate as needed  4.  Peripheral vascular disease continue Plavix  5.  Coronary artery disease continue Imdur  6.  Blood in the stool monitor CBC currently appears to be very mild likely due to internal hemorrhoids from the diarrhea  7.  Hypokalemia replace potassium.  Check BMP in a.m.  8.  GERD we will place patient on PPIs  9.  DVT prophylaxis with SCDs    All the records are reviewed and case discussed with Care Management/Social Workerr. Management plans discussed with the patient, family and they are in agreement.  CODE STATUS: dnr  TOTAL TIME TAKING CARE OF THIS PATIENT: 36 minutes.   POSSIBLE D/C IN 1-2 DAYS, DEPENDING ON CLINICAL CONDITION.  Note: This dictation was prepared with Dragon dictation along with smaller phrase technology. Any transcriptional errors that result from this process are unintentional.   Nicholes Mango M.D on 04/08/2018 at 10:51 AM  Between 7am to 6pm - Pager - [phone] After 6pm go to [email or web address] - password [email or web address] [facility]  Office  [phone]  CC: Primary care physician; Cletis Athens, MD

## 2018-04-21 NOTE — Progress Notes (Signed)
PT Cancellation Note  Patient Details Name: Lori Roberts MRN: [id number] DOB: 08/22/1921   Cancelled Treatment:    Reason Eval/Treat Not Completed: Patient declined, no reason specified, patient reports she isnt feeling well and does not want to get up this afternoon. Will re-attempt tomorrow.    Juliya Magill 2018/05/01, 3:10 PM

## 2018-04-21 NOTE — Progress Notes (Signed)
Received a pg from nurse on behalf of pt at 8:52 pm that the pt had died but no family was present. Requested nurse pg chaplain when family arrived. Visited w/ pt family at around 9:45 pm. Pt does not hv children so it was the niece and nephews present. Pt death was not expected as reported by the nurse. Pt cause of death was deemed natural causes. 3 family members present; 1 consult w/ nurse    April 24, 2018 2145  Clinical Encounter Type  Visited With Family  Visit Type Initial;Spiritual support;Social support;Death  Referral From Nurse  Consult/Referral To Chaplain  Spiritual Encounters  Spiritual Needs Emotional;Grief support  Stress Factors  Patient Stress Factors None identified  Family Stress Factors Loss

## 2018-04-21 NOTE — Death Summary Note (Signed)
[place] at [facility]    Death Note - please see Last Note for all details.  PATIENT NAME: Beyonka Pitney    MR#:  [id number]  DATE OF BIRTH:  1921-12-04  DATE OF ADMISSION:  04/09/2018  PRIMARY CARE PHYSICIAN: Cletis Athens, MD   ADMISSION DIAGNOSIS:  Acute cystitis without hematuria [N30.00] Abdominal pain, unspecified abdominal location [R10.9] Gastrointestinal hemorrhage, unspecified gastrointestinal hemorrhage type [K92.2]  HISTORY OF PRESENT ILLNESS ON ADMISSION on 03/21/2018 (as per Dr Dustin Flock):  HISTORY OF PRESENT ILLNESS: Lynda Wanninger  is a 85 y.o. female with a known history of osteoarthritis, atrial fibrillation, GERD, glaucoma, hypertension, hyperlipidemia, peripheral vascular disease who is presenting to the emergency room with complaint of abdominal pain ongoing for the past few days.  Patient states that she started having abdominal pain in the left lower quadrant and is dull aching type of pain constant in nature.  Patient also had severe diarrhea for 2 days and diarrhea is now decreased.  She also had nausea vomiting.  Patient states that she has been eating or drinking much and lives alone and appears very dehydrated in the ER.  Denies any fevers chills no chest pain palpitations.  HOSPITAL COURSE:  CT of the abdomen and pelvis on admission was negative.  Patient was diagnosed with UTI and was placed on antibiotics.  Urine culture did show E. coli with susceptibilities pending.  Patient was doing relatively well, and even took medications for nursing tonight.  Nursing went back in to evaluate the patient and she was found to be unresponsive, pulseless, and deceased.    Pronounced dead by Jannifer Franklin, Imaan Padgett FIELDING on 04-23-18 at 21:08 after 1 full minute of auscultation revealed absent heart and lung sounds, absent corneal and pupillary reflexes, and absent response to painful stimuli.                  Cause of death:  UTI   Michella Detjen FIELDING April 23, 2018, 9:18 PM  [facility] [place]  Total clinical and documentation time for today: <30 minutes  Note:  This document was prepared using Dragon voice recognition software and may include unintentional dictation errors.

## 2018-04-21 NOTE — Progress Notes (Signed)
During the assessment, patient's breathing was labored and she was put on 4L sating at 91%.  Patient took her medications without any difficulty and then would not respond to my questions.  I grabbed the dinamap and checked her oxygen and it was 84% on 4L.  I proceeded to call a rapid but the patient stopped breathing so the attending physician, Dr. Jannifer Franklin was called and time of death was pronounced at 07/13/06.  Family was notified as well and the chaplain came when family arrived.  Patient was a DNR.

## 2018-04-21 DEATH — deceased

## 2018-05-21 ENCOUNTER — Ambulatory Visit: Payer: PPO | Admitting: Podiatry

## 2019-03-15 IMAGING — CT CT ABD-PELV W/O
2 of 4 series · 15 of 46 positions shown, 17 images · non-contrast
Comparison: 08/29/2014

CLINICAL DATA: Abdominal pain and diarrhea.

EXAM:
CT ABDOMEN AND PELVIS WITHOUT CONTRAST
TECHNIQUE: Multidetector CT imaging of the abdomen and pelvis was performed
following the standard protocol without IV contrast.

[Series 2: routine abd/pel wo · axial · 0.67mm/px · z∈[-406,-46]mm · 12 of 86 slices shown, 14 images]
[im 7/86  soft-tissue]
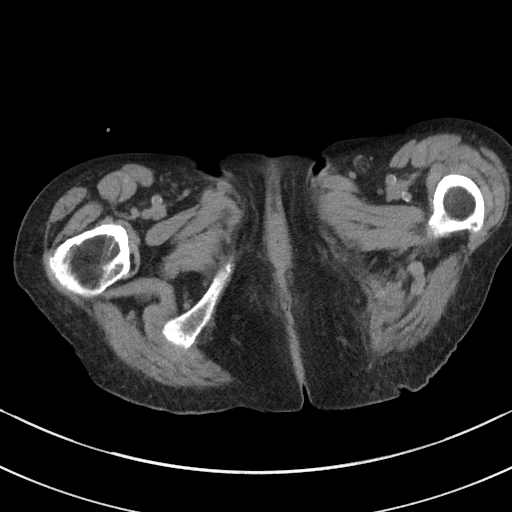
[im 7/86  bone]
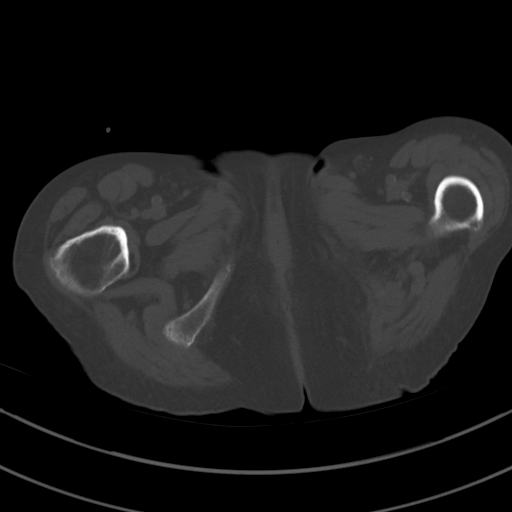
[im 14/86  soft-tissue]
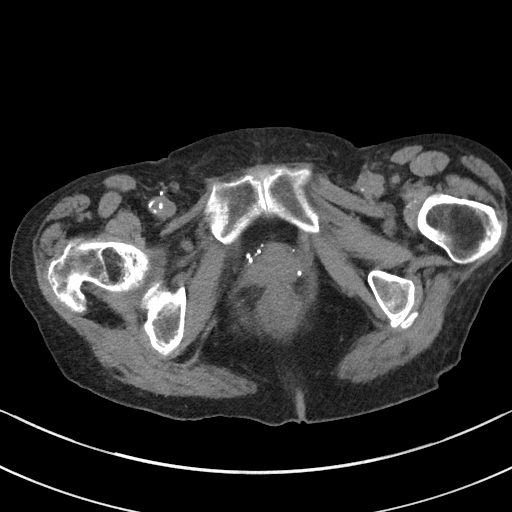
[im 20/86  soft-tissue]
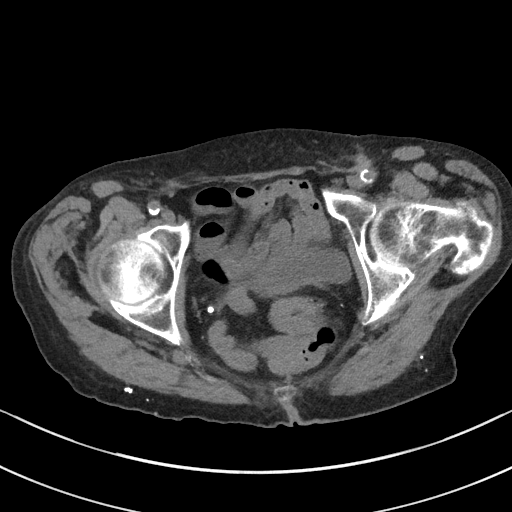
[im 27/86  soft-tissue]
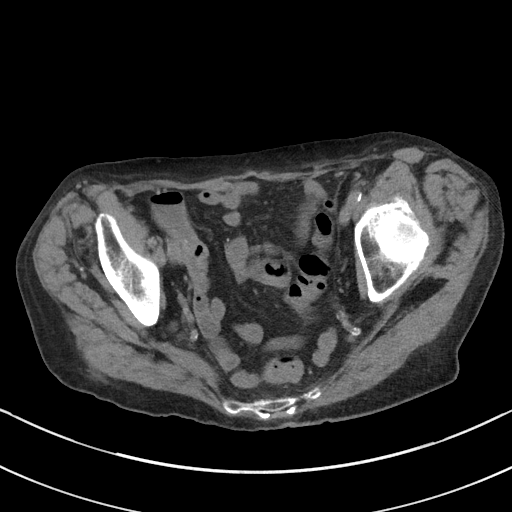
[im 33/86  soft-tissue]
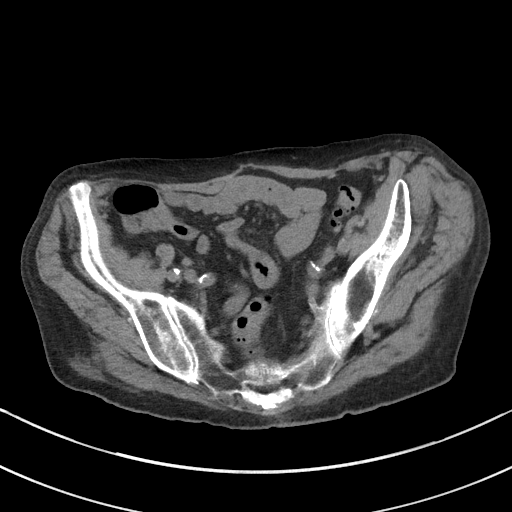
[im 40/86  soft-tissue]
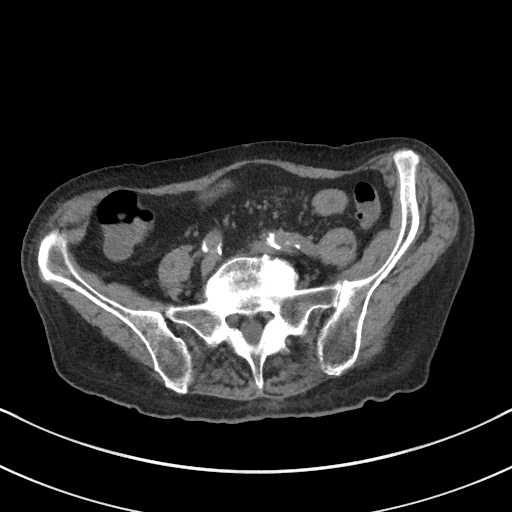
[im 46/86  soft-tissue]
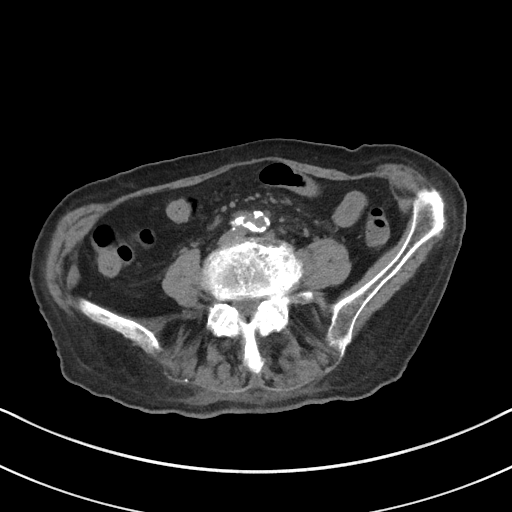
[im 53/86  soft-tissue]
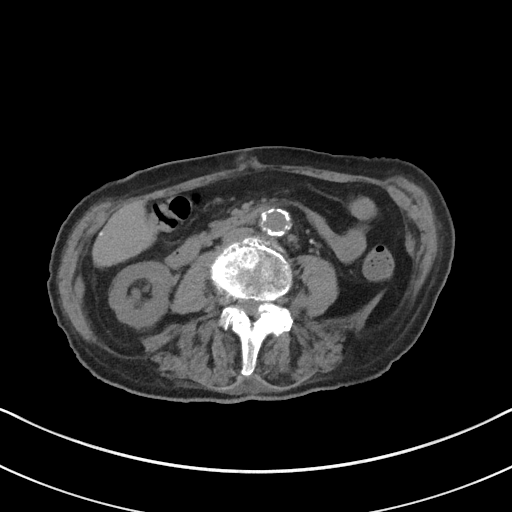
[im 59/86  soft-tissue]
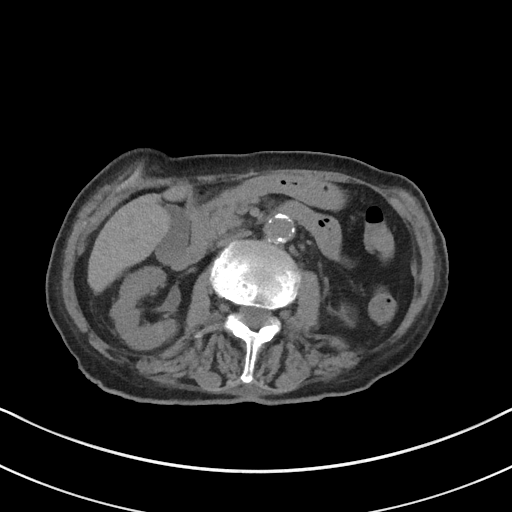
[im 59/86  bone]
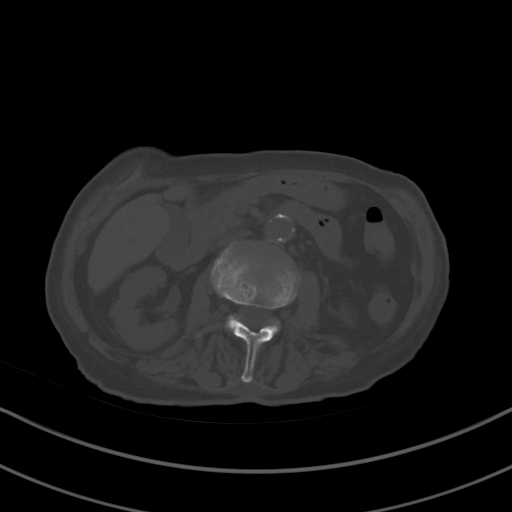
[im 66/86  soft-tissue]
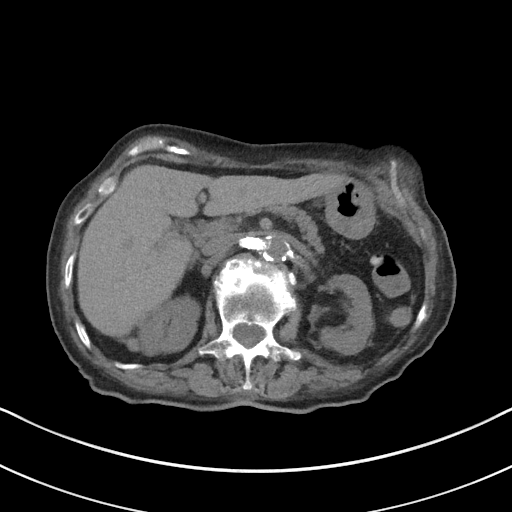
[im 72/86  soft-tissue]
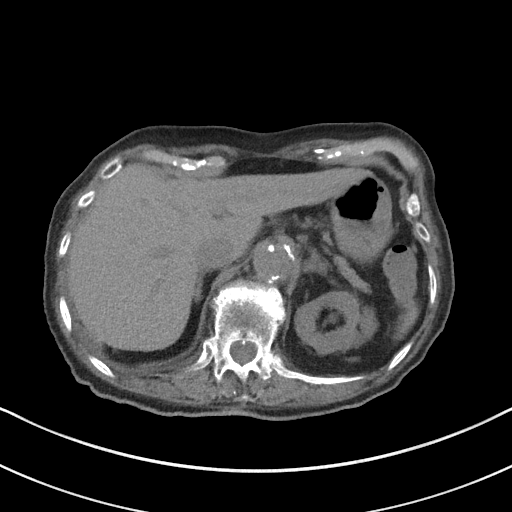
[im 79/86  soft-tissue]
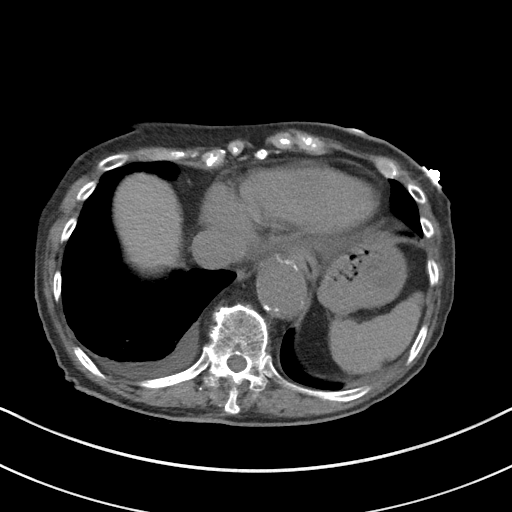

[Series 5: coronal st · coronal · 0.66mm/px · 3 of 75 slices shown]
[im 25/75  soft-tissue]
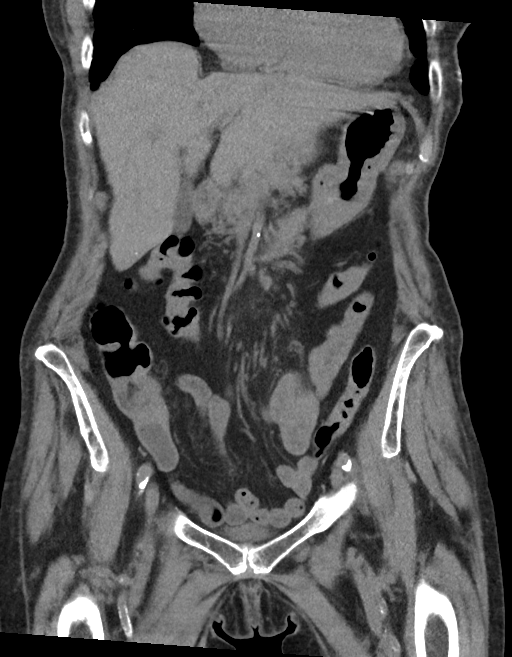
[im 33/75  soft-tissue]
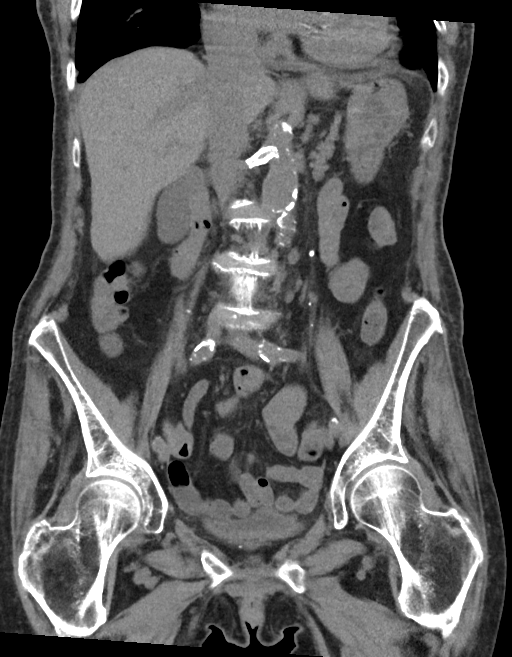
[im 42/75  soft-tissue]
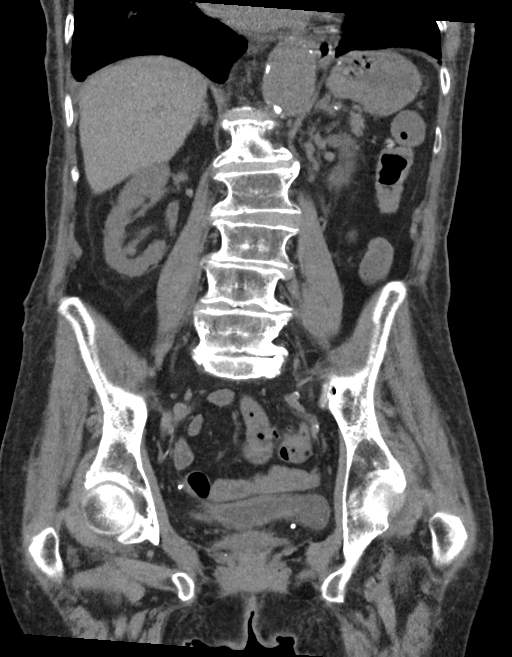

[15 of 46 positions shown; findings below may reference images not displayed]

FINDINGS: Lower chest: Heart is enlarged. Mitral annular calcification
evident. Coronary artery calcification is evident. Tiny right
pleural effusion noted

Hepatobiliary: No focal abnormality in the liver on this study
without intravenous contrast. There is no evidence for gallstones,
gallbladder wall thickening, or pericholecystic fluid. No
intrahepatic or extrahepatic biliary dilation.

Pancreas: No focal mass lesion. No dilatation of the main duct. No
intraparenchymal cyst. No peripancreatic edema.

Spleen: No splenomegaly. No focal mass lesion.

Adrenals/Urinary Tract: No adrenal nodule or mass. 10 mm exophytic
lesion upper pole right kidney has attenuation too high to be a
simple cyst. This was present on the prior study and measured 9 mm
at that time suggesting no substantial interval change and likely a
cyst complicated by proteinaceous debris or hemorrhage. Similar 4 mm
lesion identified interpolar right kidney . Simple cysts are noted
in the kidneys bilaterally similar to prior. 4 mm hyperattenuating
focus lower pole left kidney is indeterminate. No hydronephrosis. No
evidence for hydroureter. The urinary bladder appears normal for the
degree of distention.

Stomach/Bowel: Stomach is nondistended. No gastric wall thickening.
No evidence of outlet obstruction. Duodenum is normally positioned
as is the ligament of Treitz. No small bowel wall thickening. No
small bowel dilatation. The terminal ileum is normal. The appendix
is not visualized, but there is no edema or inflammation in the
region of the cecum. No gross colonic mass. No colonic wall
thickening. Diverticular changes are noted in the left colon without
evidence of diverticulitis.

Vascular/Lymphatic: There is abdominal aortic atherosclerosis
without aneurysm. Right renal artery stent device noted. There is no
gastrohepatic or hepatoduodenal ligament lymphadenopathy. No
intraperitoneal or retroperitoneal lymphadenopathy. No pelvic
sidewall lymphadenopathy.

Reproductive: Uterus surgically absent.  There is no adnexal mass.

Other: No intraperitoneal free fluid.

Musculoskeletal: No worrisome lytic or sclerotic osseous
abnormality.
IMPRESSION: 1. No acute findings in the abdomen or pelvis. Specifically, no
findings to explain the patient's history of abdominal pain and
diarrhea.
2. Left colonic diverticulosis without diverticulitis.
3. Tiny right pleural effusion, stable.
4.  Aortic Atherosclerois ([id number])
